# Patient Record
Sex: Female | Born: 1945 | Race: White | Hispanic: No | Marital: Married | Smoking: Former smoker
Health system: Southern US, Community
[De-identification: ages and names within clinical notes are randomized; demographics above are authoritative.]

## PROBLEM LIST (undated history)

## (undated) DIAGNOSIS — Q782 Osteopetrosis: Secondary | ICD-10-CM

## (undated) DIAGNOSIS — F0390 Unspecified dementia without behavioral disturbance: Secondary | ICD-10-CM

## (undated) DIAGNOSIS — T4145XA Adverse effect of unspecified anesthetic, initial encounter: Secondary | ICD-10-CM

## (undated) DIAGNOSIS — R112 Nausea with vomiting, unspecified: Secondary | ICD-10-CM

## (undated) DIAGNOSIS — N289 Disorder of kidney and ureter, unspecified: Secondary | ICD-10-CM

## (undated) DIAGNOSIS — I10 Essential (primary) hypertension: Secondary | ICD-10-CM

## (undated) DIAGNOSIS — T8859XA Other complications of anesthesia, initial encounter: Secondary | ICD-10-CM

## (undated) DIAGNOSIS — Z9889 Other specified postprocedural states: Secondary | ICD-10-CM

## (undated) HISTORY — PX: APPENDECTOMY: SHX54

## (undated) HISTORY — PX: BACK SURGERY: SHX140

## (undated) HISTORY — PX: ABDOMINAL HYSTERECTOMY: SHX81

---
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
## 2013-11-09 ENCOUNTER — Ambulatory Visit (INDEPENDENT_AMBULATORY_CARE_PROVIDER_SITE_OTHER): Payer: Medicare Other | Admitting: Diagnostic Neuroimaging

## 2013-11-09 ENCOUNTER — Encounter: Payer: Self-pay | Admitting: Diagnostic Neuroimaging

## 2013-11-09 VITALS — BP 129/79 | HR 70 | Ht 63.0 in | Wt 141.0 lb

## 2013-11-09 DIAGNOSIS — F03B0 Unspecified dementia, moderate, without behavioral disturbance, psychotic disturbance, mood disturbance, and anxiety: Secondary | ICD-10-CM

## 2013-11-09 DIAGNOSIS — F0390 Unspecified dementia without behavioral disturbance: Secondary | ICD-10-CM

## 2013-11-09 NOTE — Progress Notes (Signed)
[facility]  PATIENT: Tara Henderson DOB: 12-Aug-1945  REFERRING CLINICIAN: S Burdine HISTORY FROM: patient and daughter  REASON FOR VISIT: new consult   HISTORICAL  CHIEF COMPLAINT:  Chief Complaint  Patient presents with  . Memory Loss    HISTORY OF PRESENT ILLNESS:   68 year old right-handed female here for evaluation of memory loss. For past 2 years patient has had progressive short-term memory problems and cognitive decline. This is noticed by patient's sister who is living with at the time initially, but now by the daughter. Patient is still able to wake up, dress herself, cook, clean, live independently. She still drives to the store and shops. Last week she got lost while driving to visit her sister at the nursing home. No car accidents.  Patient has tried Aricept but could not tolerate due to GI side effects. She was transitioned to Namenda 10 mg twice a day and has maintained on that. There is some mild benefit initially but now patient has continued to progress.  No insomnia, mood difficulty, hallucinations, gait difficulty.  REVIEW OF SYSTEMS: Full 14 system review of systems performed and notable only for  Memory loss confusion.  ALLERGIES: No Known Allergies  HOME MEDICATIONS: No outpatient prescriptions prior to visit.   No facility-administered medications prior to visit.    PAST MEDICAL HISTORY: History reviewed. No pertinent past medical history.  PAST SURGICAL HISTORY: History reviewed. No pertinent past surgical history.  FAMILY HISTORY: Family History  Problem Relation Age of Onset  . Heart attack Mother   . Other Father     murdered    SOCIAL HISTORY:  History   Social History  . Marital Status: Married    Spouse Name: N/A    Number of Children: 1  . Years of Education: Hs   Occupational History  . Retired Other   Social History Main Topics  . Smoking status: Never Smoker   . Smokeless tobacco: Never Used  .  Alcohol Use: No  . Drug Use: No  . Sexual Activity: Not on file   Other Topics Concern  . Not on file   Social History Narrative   Patient lives at home alone.   Caffeine Use: very little     PHYSICAL EXAM  Filed Vitals:   11/09/13 0916  BP: 129/79  Pulse: 70  Height: 5\' 3"  (1.6 m)  Weight: 141 lb (63.957 kg)    Not recorded    Body mass index is 24.98 kg/(m^2).  GENERAL EXAM: Patient is in no distress; well developed, nourished and groomed; neck is supple  CARDIOVASCULAR: Regular rate and rhythm, no murmurs, no carotid bruits  NEUROLOGIC: MENTAL STATUS: awake, alert, oriented to person, NOT TIME OR PLACE. Recent memory intact, RECALL 0/3. CANNOT DRAW PENTAGONS OR WRITE SENTENCE. DECR attention and concentration, language fluent, comprehension intact, naming intact, fund of knowledge appropriate. MMSE 10/30. AFT 5. CANNOT DRAW CLOCK. GDS 0. NEG MYERSONS, PALMOMENTAL, SNOUT REFLEXES. CRANIAL NERVE: no papilledema on fundoscopic exam, pupils equal and reactive to light, visual fields full to confrontation, extraocular muscles intact, no nystagmus, facial sensation and strength symmetric, hearing intact, palate elevates symmetrically, uvula midline, shoulder shrug symmetric, tongue midline. MOTOR: normal bulk and tone, full strength in the BUE, BLE SENSORY: normal and symmetric to light touch, temperature, vibration  COORDINATION: finger-nose-finger, fine finger movements normal REFLEXES: deep tendon reflexes present and symmetric; ABSENT AT ANKLES GAIT/STATION: narrow based gait; romberg is negative    DIAGNOSTIC DATA (LABS, IMAGING, TESTING) -  I reviewed patient records, labs, notes, testing and imaging myself where available.  No results found for this basename: WBC, HGB, HCT, MCV, PLT   No results found for this basename: na, k, cl, co2, glucose, bun, creatinine, calcium, prot, albumin, ast, alt, alkphos, bilitot, gfrnonaa, gfraa   No results found for this  basename: CHOL, HDL, LDLCALC, LDLDIRECT, TRIG, CHOLHDL   No results found for this basename: HGBA1C   No results found for this basename: VITAMINB12   No results found for this basename: TSH     ASSESSMENT AND PLAN  68 y.o. year old female here with moderate dementia without behavioral disturbance.  PLAN: - continue namenda; may transition to XR 28mg  daily (getting meds through company assistance program) - caution with driving; probably should transition to stop driving based on low MMSE and also getting lost while driving last week - safety, prognosis and other issues reviewed with patient and duaghter  Return in about 6 months (around 05/12/2014).   Suanne MarkerVIKRAM R. PENUMALLI, MD 11/09/2013, 10:27 AM Certified in Neurology, Neurophysiology and Neuroimaging  [facility] [address], [address] [place], [place] [address] [phone]

## 2013-11-09 NOTE — Patient Instructions (Signed)
Dementia Dementia is a general term for problems with brain function. A person with dementia has memory loss and a hard time with at least one other brain function such as thinking, speaking, or problem solving. Dementia can affect social functioning, how you do your job, your mood, or your personality. The changes may be hidden for a long time. The earliest forms of this disease are usually not detected by family or friends. Dementia can be:  Irreversible.  Potentially reversible.  Partially reversible.  Progressive. This means it can get worse over time. SIGNS AND SYMPTOMS  Symptoms are often hard to detect. Family members or coworkers may not notice them early in the disease process. Different people with dementia may have different symptoms. Symptoms can include:  A hard time with memory, especially recent memory. Long-term memory may not be impaired.  Asking the same question multiple times or forgetting something someone just said.  A hard time speaking your thoughts or finding certain words.  A hard time solving problems or performing familiar tasks (such as how to use a telephone).  Sudden changes in mood.  Changes in personality, especially increasing moodiness or mistrust.  Depression.  A hard time understanding complex ideas that were never a problem in the past. DIAGNOSIS  There are no specific tests for dementia.   Your health care provider may recommend a thorough evaluation. This is because some forms of dementia can be reversible. The evaluation will likely include a physical exam and getting a detailed history from you and a family member. The history often gives the best clues and suggestions for a diagnosis.  Memory testing may be done. A detailed brain function evaluation called neuropsychologic testing may be helpful.  Lab tests and brain imaging (such as a CT scan or MRI scan) are sometimes important.  Sometimes observation and re-evaluation over time is  very helpful. TREATMENT  Treatment depends on the cause.   If the problem is a vitamin deficiency, it may be helped or cured with supplements.  For dementias such as Alzheimer disease, medicines are available to stabilize or slow the course of the disease. There are no cures for this type of dementia.  Your health care provider can help direct you to groups, organizations, and other health care providers to help with decisions in the care of you or your loved one. HOME CARE INSTRUCTIONS The care of individuals with dementia is varied and dependent upon the progression of the dementia. The following suggestions are intended for the person living with, or caring for, the person with dementia.  Create a safe environment.  Remove the locks on bathroom doors to prevent the person from accidentally locking himself or herself in.  Use childproof latches on kitchen cabinets and any place where cleaning supplies, chemicals, or alcohol are kept.  Use childproof covers in unused electrical outlets.  Install childproof devices to keep doors and windows secured.  Remove stove knobs or install safety knobs and an automatic shut-off on the stove.  Lower the temperature on water heaters.  Label medicines and keep them locked up.  Secure knives, lighters, matches, power tools, and guns, and keep these items out of reach.  Keep the house free from clutter. Remove rugs or anything that might contribute to a fall.  Remove objects that might break and hurt the person.  Make sure lighting is good, both inside and outside.  Install grab rails as needed.  Use a monitoring device to alert you to falls or other  needs for help.  Reduce confusion.  Keep familiar objects and people around.  Use night lights or dim lights at night.  Label items or areas.  Use reminders, notes, or directions for daily activities or tasks.  Keep a simple, consistent routine for waking, meals, bathing, dressing, and  bedtime.  Create a calm, quiet environment.  Place large clocks and calendars prominently.  Display emergency numbers and home address near all telephones.  Use cues to establish different times of the day. An example is to open curtains to let the natural light in during the day.   Use effective communication.  Choose simple words and short sentences.  Use a gentle, calm tone of voice.  Be careful not to interrupt.  If the person is struggling to find a word or communicate a thought, try to provide the word or thought.  Ask one question at a time. Allow the person ample time to answer questions. Repeat the question again if the person does not respond.  Reduce nighttime restlessness.  Provide a comfortable bed.  Have a consistent nighttime routine.  Ensure a regular walking or physical activity schedule. Involve the person in daily activities as much as possible.  Limit napping during the day.  Limit caffeine.  Attend social events that stimulate rather than overwhelm the senses.  Encourage good nutrition and hydration.  Reduce distractions during meal times and snacks.  Avoid foods that are too hot or too cold.  Monitor chewing and swallowing ability.  Continue with routine vision, hearing, dental, and medical screenings.  Give medicines only as directed by the health care provider.  Monitor driving abilities. Do not allow the person to drive when safe driving is no longer possible.  Register with an identification program which could provide location assistance in the event of a missing person situation. SEEK MEDICAL CARE IF:   New behavioral problems start such as moodiness, aggressiveness, or seeing things that are not there (hallucinations).  Any new problem with brain function happens. This includes problems with balance, speech, or falling a lot.  Problems with swallowing develop.  Any symptoms of other illness happen. Small changes or worsening in  any aspect of brain function can be a sign that the illness is getting worse. It can also be a sign of another medical illness such as infection. Seeing a health care provider right away is important. SEEK IMMEDIATE MEDICAL CARE IF:   A fever develops.  New or worsened confusion develops.  New or worsened sleepiness develops.  Staying awake becomes hard to do. Document Released: 09/19/2000 Document Revised: 08/10/2013 Document Reviewed: 08/21/2010 [facility] Patient Information 2015 [place] [place]. This information is not intended to replace advice given to you by your health care provider. Make sure you discuss any questions you have with your health care provider.

## 2014-05-12 ENCOUNTER — Ambulatory Visit: Payer: No Typology Code available for payment source | Admitting: Diagnostic Neuroimaging

## 2014-05-13 ENCOUNTER — Encounter: Payer: Self-pay | Admitting: Diagnostic Neuroimaging

## 2016-02-16 ENCOUNTER — Emergency Department (HOSPITAL_COMMUNITY)
Admission: EM | Admit: 2016-02-16 | Discharge: 2016-02-17 | Disposition: A | Discharge status: Home / Self Care | Payer: Medicare Other | Attending: Emergency Medicine | Admitting: Emergency Medicine

## 2016-02-16 ENCOUNTER — Emergency Department (HOSPITAL_COMMUNITY): Payer: Medicare Other

## 2016-02-16 ENCOUNTER — Encounter (HOSPITAL_COMMUNITY): Payer: Self-pay | Admitting: *Deleted

## 2016-02-16 DIAGNOSIS — Z7982 Long term (current) use of aspirin: Secondary | ICD-10-CM | POA: Diagnosis not present

## 2016-02-16 DIAGNOSIS — F0390 Unspecified dementia without behavioral disturbance: Secondary | ICD-10-CM | POA: Insufficient documentation

## 2016-02-16 DIAGNOSIS — R4182 Altered mental status, unspecified: Secondary | ICD-10-CM | POA: Insufficient documentation

## 2016-02-16 DIAGNOSIS — R0989 Other specified symptoms and signs involving the circulatory and respiratory systems: Secondary | ICD-10-CM | POA: Diagnosis not present

## 2016-02-16 DIAGNOSIS — Z79899 Other long term (current) drug therapy: Secondary | ICD-10-CM | POA: Diagnosis not present

## 2016-02-16 DIAGNOSIS — I10 Essential (primary) hypertension: Secondary | ICD-10-CM | POA: Diagnosis not present

## 2016-02-16 HISTORY — DX: Disorder of kidney and ureter, unspecified: N28.9

## 2016-02-16 HISTORY — DX: Hypercalcemia: E83.52

## 2016-02-16 HISTORY — DX: Essential (primary) hypertension: I10

## 2016-02-16 HISTORY — DX: Unspecified dementia, unspecified severity, without behavioral disturbance, psychotic disturbance, mood disturbance, and anxiety: F03.90

## 2016-02-16 HISTORY — DX: Osteopetrosis: Q78.2

## 2016-02-16 LAB — COMPREHENSIVE METABOLIC PANEL
ALK PHOS: 55 U/L (ref 38–126)
ALT: 21 U/L (ref 14–54)
AST: 23 U/L (ref 15–41)
Albumin: 4.2 g/dL (ref 3.5–5.0)
Anion gap: 7 (ref 5–15)
BILIRUBIN TOTAL: 0.5 mg/dL (ref 0.3–1.2)
BUN: 15 mg/dL (ref 6–20)
CALCIUM: 9.4 mg/dL (ref 8.9–10.3)
CHLORIDE: 105 mmol/L (ref 101–111)
CO2: 28 mmol/L (ref 22–32)
CREATININE: 0.93 mg/dL (ref 0.44–1.00)
Glucose, Bld: 131 mg/dL — ABNORMAL HIGH (ref 65–99)
Potassium: 3.7 mmol/L (ref 3.5–5.1)
Sodium: 140 mmol/L (ref 135–145)
Total Protein: 6.8 g/dL (ref 6.5–8.1)

## 2016-02-16 LAB — CBC
HCT: 40.8 % (ref 36.0–46.0)
Hemoglobin: 13.7 g/dL (ref 12.0–15.0)
MCH: 28.4 pg (ref 26.0–34.0)
MCHC: 33.6 g/dL (ref 30.0–36.0)
MCV: 84.6 fL (ref 78.0–100.0)
PLATELETS: 228 10*3/uL (ref 150–400)
RBC: 4.82 MIL/uL (ref 3.87–5.11)
RDW: 13.2 % (ref 11.5–15.5)
WBC: 10.4 10*3/uL (ref 4.0–10.5)

## 2016-02-16 LAB — URINALYSIS, ROUTINE W REFLEX MICROSCOPIC
Bilirubin Urine: NEGATIVE
GLUCOSE, UA: NEGATIVE mg/dL
HGB URINE DIPSTICK: NEGATIVE
KETONES UR: NEGATIVE mg/dL
LEUKOCYTES UA: NEGATIVE
Nitrite: NEGATIVE
PROTEIN: NEGATIVE mg/dL
Specific Gravity, Urine: 1.013 (ref 1.005–1.030)
pH: 7.5 (ref 5.0–8.0)

## 2016-02-16 NOTE — ED Provider Notes (Signed)
[facility]-EMERGENCY DEPT Provider Note   CSN: [id number] Arrival date & time: 02/16/16  2145 By signing my name below, I, Linus Galas, attest that this documentation has been prepared under the direction and in the presence of Melburn Hake, [place]. Electronically Signed: Linus Galas, ED Scribe. 02/16/16. 10:58 PM.  History   Chief Complaint Chief Complaint  Patient presents with  . Altered Mental Status   The history is provided by the nursing home and the EMS personnel. No language interpreter was used.   HPI Comments: Tara Henderson is a 70 y.o. female who presents to the Emergency Department via EMS with a PMHx of dementia and HTN for an evaluation of altered mental status prior to arrival. Pt was brought from [facility] where the NH staff reported the pt was" more tired than normal" after taking her evening medications which include Trazadone. EMS found the pt sleeping in the chair. Pt with dementia at baseline and can only recall her name. Reports mild intermittent cough.    Level V caveat-dementia  Past Medical History:  Diagnosis Date  . Dementia   . Hypercalcemia   . Hypertension   . Osteopetrosis   . Renal disorder    Patient Active Problem List   Diagnosis Date Noted  . Moderate dementia without behavioral disturbance 11/09/2013   No past surgical history on file.  OB History    No data available     Home Medications    Prior to Admission medications   Medication Sig Start Date End Date Taking? Authorizing Provider  acetaminophen (TYLENOL) 500 MG tablet Take 500 mg by mouth every 4 (four) hours as needed for mild pain.   Yes Historical Provider, MD  ALPRAZolam Prudy Feeler) 0.25 MG tablet Take 0.25 mg by mouth daily as needed (agitation).   Yes Historical Provider, MD  alum & mag hydroxide-simeth (MAALOX PLUS) 400-400-40 MG/5ML suspension Take 15 mLs by mouth every 6 (six) hours as needed for indigestion.   Yes Historical Provider, MD  aspirin 81 MG chewable  tablet Chew 81 mg by mouth daily.   Yes Historical Provider, MD  atorvastatin (LIPITOR) 20 MG tablet Take 20 mg by mouth daily.   Yes Historical Provider, MD  cholecalciferol (VITAMIN D) 1000 units tablet Take 2,000 Units by mouth daily.   Yes Historical Provider, MD  guaifenesin (ROBITUSSIN) 100 MG/5ML syrup Take 200 mg by mouth 3 (three) times daily as needed for cough.   Yes Historical Provider, MD  loperamide (IMODIUM) 2 MG capsule Take 2 mg by mouth as needed for diarrhea or loose stools.   Yes Historical Provider, MD  magnesium hydroxide (MILK OF MAGNESIA) 400 MG/5ML suspension Take 30 mLs by mouth at bedtime as needed for mild constipation.   Yes Historical Provider, MD  Memantine HCl ER (NAMENDA XR) 21 MG CP24 Take 1 capsule by mouth daily.   Yes Historical Provider, MD  neomycin-bacitracin-polymyxin (NEOSPORIN) [phone] ointment Apply 1 application topically as needed (skin tears).   Yes Historical Provider, MD  traZODone (DESYREL) 50 MG tablet Take 50 mg by mouth at bedtime.   Yes Historical Provider, MD   Family History Family History  Problem Relation Age of Onset  . Heart attack Mother   . Other Father     murdered    Social History Social History  Substance Use Topics  . Smoking status: Never Smoker  . Smokeless tobacco: Never Used  . Alcohol use No   Allergies   Patient has no known allergies.  Review  of Systems Review of Systems  Unable to perform ROS: Dementia   Physical Exam Updated Vital Signs BP 123/68 (BP Location: Left Arm)   Pulse 68   Temp 100.3 F (37.9 C) (Rectal)   Resp 17   SpO2 96%   Physical Exam  Constitutional: She appears well-developed and well-nourished. No distress.  Pt sleeping but easily arousable.  HENT:  Head: Normocephalic and atraumatic.  Mouth/Throat: Oropharynx is clear and moist. No oropharyngeal exudate.  Eyes: Conjunctivae and EOM are normal. Pupils are equal, round, and reactive to light. Right eye exhibits no discharge.  Left eye exhibits no discharge. No scleral icterus.  Neck: Normal range of motion. Neck supple.  Cardiovascular: Normal rate, regular rhythm, normal heart sounds and intact distal pulses.   Pulmonary/Chest: Effort normal. No respiratory distress. She has no wheezes. She has rales (mild rales noted in left lower lobe). She exhibits no tenderness.  Abdominal: Soft. Bowel sounds are normal. She exhibits no distension and no mass. There is no tenderness. There is no rebound and no guarding. No hernia.  Musculoskeletal: Normal range of motion. She exhibits no edema or tenderness.  Neurological: She is alert.  Exam limited due to pt compliance. Pt is only alert to person. Pt will not follow commands but is moving all four extremities spontaneously.   Skin: Skin is warm and dry. Capillary refill takes less than 2 seconds. No rash noted. She is not diaphoretic.  Nursing note and vitals reviewed.  ED Treatments / Results  DIAGNOSTIC STUDIES: Oxygen Saturation is 91% on room air, low by my interpretation.    COORDINATION OF CARE: 10:58 PM Discussed treatment plan with pt at bedside and pt agreed to plan.  Labs (all labs ordered are listed, but only abnormal results are displayed) Labs Reviewed  COMPREHENSIVE METABOLIC PANEL - Abnormal; Notable for the following:       Result Value   Glucose, Bld 131 (*)    All other components within normal limits  CBC  URINALYSIS, ROUTINE W REFLEX MICROSCOPIC (NOT AT [facility])    EKG  EKG Interpretation  Date/Time:  Thursday February 16 2016 21:57:58 EST Ventricular Rate:  80 PR Interval:    QRS Duration: 82 QT Interval:  377 QTC Calculation: 435 R Axis:   65 Text Interpretation:  Sinus rhythm LAE, consider biatrial enlargement Otherwise within normal limits No old tracing to compare Confirmed by [facility] HospitalGLICK  MD, DAVID ([id number]) on 02/16/2016 11:27:26 PM       Radiology Dg Chest 2 View  Result Date: 02/16/2016 CLINICAL DATA:  Cough and rales EXAM: CHEST  2  VIEW COMPARISON:  None. FINDINGS: AP semi-erect view demonstrates linear atelectasis or fibrosis at the lung bases. No acute consolidation or effusion. Heart size normal. Tortuous aorta with atherosclerosis. No pneumothorax. IMPRESSION: Mild bibasilar atelectasis or scarring.  No acute infiltrate. Atherosclerosis Electronically Signed   By: Jasmine PangKim  Fujinaga M.D.   On: 02/16/2016 23:27    Procedures Procedures (including critical care time)  Medications Ordered in ED Medications - No data to display   Initial Impression / Assessment and Plan / ED Course  I have reviewed the triage vital signs and the nursing notes.  Pertinent labs & imaging results that were available during my care of the patient were reviewed by me and considered in my medical decision making (see chart for details).  Clinical Course     Patient presents from nursing facility with reported altered mental status after taking her evening medications that included trazodone.  Nursing facility reports patient was more sleepy. History of dementa. VSS. Exam limited due to pt compliance. Mild rales noted in LLQ. Pt alert to self only. Moving all 4 extremities spontaneously. Abdominal exam benign. EKG showed sinus rhythm with no acute ischemic changes. Labs and UA unremarkable. No signs of infection. Pt appears to be at baseline mental status. [facility].   Final Clinical Impressions(s) / ED Diagnoses   Final diagnoses:  Altered mental status, unspecified altered mental status type    New Prescriptions Discharge Medication List as of 02/17/2016 12:36 AM     I personally performed the services described in this documentation, which was scribed in my presence. The recorded information has been reviewed and is accurate.     Satira Sarkicole Elizabeth BenhamNadeau, [place]-C 02/17/16 0304    Dione Boozeavid Glick, MD 02/17/16 [phone]

## 2016-02-16 NOTE — ED Notes (Signed)
Bed: [id number] Expected date:  Expected time:  Means of arrival:  Comments[facility]

## 2016-02-16 NOTE — ED Triage Notes (Signed)
[facility] Per report after taking her evening medications (including Trazadone), staff reports "more lethargic than normal" On arrival to scene, pt was sleeping the chair in the activity room at the facility. Pt has dementia and at baseline, pt only can recall her name. VSS en route. Pt with intermittent cough.RA sats were 91-92% improved to 95% on 2 liters. CBG 184.

## 2016-02-17 NOTE — Discharge Instructions (Signed)
Continue taking your home medications as prescribed. I recommend following up with your primary care provider within the next week for follow-up evaluation. Return to the emergency department if symptoms worsen or new onset of fever, change in behavior/change in mental status, weakness, chest pain, difficulty breathing, productive cough, urinary symptoms, abdominal pain, vomiting.

## 2016-09-24 ENCOUNTER — Encounter (HOSPITAL_COMMUNITY): Payer: Self-pay | Admitting: Nurse Practitioner

## 2016-09-24 ENCOUNTER — Emergency Department (HOSPITAL_COMMUNITY): Payer: Medicare Other

## 2016-09-24 ENCOUNTER — Emergency Department (HOSPITAL_COMMUNITY)
Admission: EM | Admit: 2016-09-24 | Discharge: 2016-09-24 | Disposition: A | Discharge status: Skilled Nursing Facility | Payer: Medicare Other | Attending: Emergency Medicine | Admitting: Emergency Medicine

## 2016-09-24 DIAGNOSIS — Y92129 Unspecified place in nursing home as the place of occurrence of the external cause: Secondary | ICD-10-CM | POA: Diagnosis not present

## 2016-09-24 DIAGNOSIS — W07XXXA Fall from chair, initial encounter: Secondary | ICD-10-CM | POA: Insufficient documentation

## 2016-09-24 DIAGNOSIS — Z79899 Other long term (current) drug therapy: Secondary | ICD-10-CM | POA: Insufficient documentation

## 2016-09-24 DIAGNOSIS — S0003XA Contusion of scalp, initial encounter: Secondary | ICD-10-CM | POA: Diagnosis not present

## 2016-09-24 DIAGNOSIS — S8001XA Contusion of right knee, initial encounter: Secondary | ICD-10-CM | POA: Diagnosis not present

## 2016-09-24 DIAGNOSIS — Y939 Activity, unspecified: Secondary | ICD-10-CM | POA: Diagnosis not present

## 2016-09-24 DIAGNOSIS — N189 Chronic kidney disease, unspecified: Secondary | ICD-10-CM | POA: Diagnosis not present

## 2016-09-24 DIAGNOSIS — I129 Hypertensive chronic kidney disease with stage 1 through stage 4 chronic kidney disease, or unspecified chronic kidney disease: Secondary | ICD-10-CM | POA: Insufficient documentation

## 2016-09-24 DIAGNOSIS — N390 Urinary tract infection, site not specified: Secondary | ICD-10-CM

## 2016-09-24 DIAGNOSIS — W19XXXA Unspecified fall, initial encounter: Secondary | ICD-10-CM

## 2016-09-24 DIAGNOSIS — S0990XA Unspecified injury of head, initial encounter: Secondary | ICD-10-CM | POA: Diagnosis present

## 2016-09-24 DIAGNOSIS — Y999 Unspecified external cause status: Secondary | ICD-10-CM | POA: Diagnosis not present

## 2016-09-24 LAB — URINALYSIS, ROUTINE W REFLEX MICROSCOPIC
BILIRUBIN URINE: NEGATIVE
GLUCOSE, UA: NEGATIVE mg/dL
HGB URINE DIPSTICK: NEGATIVE
Ketones, ur: NEGATIVE mg/dL
NITRITE: NEGATIVE
PH: 5 (ref 5.0–8.0)
Protein, ur: NEGATIVE mg/dL
Specific Gravity, Urine: 1.021 (ref 1.005–1.030)

## 2016-09-24 LAB — CBC WITH DIFFERENTIAL/PLATELET
BASOS PCT: 0 %
Basophils Absolute: 0 10*3/uL (ref 0.0–0.1)
Eosinophils Absolute: 0.1 10*3/uL (ref 0.0–0.7)
Eosinophils Relative: 1 %
HEMATOCRIT: 38.5 % (ref 36.0–46.0)
HEMOGLOBIN: 12.2 g/dL (ref 12.0–15.0)
LYMPHS ABS: 1.3 10*3/uL (ref 0.7–4.0)
Lymphocytes Relative: 18 %
MCH: 28.2 pg (ref 26.0–34.0)
MCHC: 31.7 g/dL (ref 30.0–36.0)
MCV: 89.1 fL (ref 78.0–100.0)
MONO ABS: 0.4 10*3/uL (ref 0.1–1.0)
MONOS PCT: 5 %
Neutro Abs: 5.5 10*3/uL (ref 1.7–7.7)
Neutrophils Relative %: 76 %
Platelets: 253 10*3/uL (ref 150–400)
RBC: 4.32 MIL/uL (ref 3.87–5.11)
RDW: 13.4 % (ref 11.5–15.5)
WBC: 7.3 10*3/uL (ref 4.0–10.5)

## 2016-09-24 LAB — COMPREHENSIVE METABOLIC PANEL
ALBUMIN: 3.9 g/dL (ref 3.5–5.0)
ALK PHOS: 58 U/L (ref 38–126)
ALT: 13 U/L — ABNORMAL LOW (ref 14–54)
ANION GAP: 6 (ref 5–15)
AST: 19 U/L (ref 15–41)
BUN: 31 mg/dL — ABNORMAL HIGH (ref 6–20)
CALCIUM: 9.3 mg/dL (ref 8.9–10.3)
CO2: 28 mmol/L (ref 22–32)
Chloride: 109 mmol/L (ref 101–111)
Creatinine, Ser: 0.91 mg/dL (ref 0.44–1.00)
GFR calc non Af Amer: >60 mL/min (ref >60)
GLUCOSE: 113 mg/dL — AB (ref 65–99)
POTASSIUM: 3.6 mmol/L (ref 3.5–5.1)
Sodium: 143 mmol/L (ref 135–145)
TOTAL PROTEIN: 6.6 g/dL (ref 6.5–8.1)
Total Bilirubin: 0.4 mg/dL (ref 0.3–1.2)

## 2016-09-24 MED ORDER — CEPHALEXIN 500 MG PO CAPS: 500.0000 mg | ORAL_CAPSULE | Freq: Two times a day (BID) | ORAL | 0 refills | Status: AC

## 2016-09-24 NOTE — ED Triage Notes (Signed)
[facility] after suffering a fall from her wheelchair today. Facility staff reports that she hit her head and has a palpable 'knot' on right side of her head. Patient complained of pain in right arm and right leg to EMS but mostly non-verbal. History of dementia. In C-collar, alert, folling commands on arrival.

## 2016-09-24 NOTE — ED Notes (Signed)
[facility] picking up patient. 

## 2016-09-24 NOTE — ED Notes (Addendum)
Called to check on status of patient being transported back to facility.

## 2016-09-24 NOTE — ED Notes (Signed)
[facility] Expected date:  Expected time:  Means of arrival:  Comments: EMS 71 yo, Fall

## 2016-09-24 NOTE — ED Provider Notes (Signed)
[facility]-EMERGENCY DEPT Provider Note   CSN: [id number] Arrival date & time: 09/24/16  1217     History   Chief Complaint Chief Complaint  Patient presents with  . Fall    HPI Tara Henderson is a 71 y.o. female with a PMHx of dementia, hypercalcemia, HTN, osteopetrosis, and renal disorder, who presents to the ED with complaints of fall at nursing home which was witnessed by nursing home staff and family members. Level V caveat due to dementia, history obtained by Tammy RN at nursing home. Tammy states that the patient fell from a chair, which was witnessed, unclear exactly why she fell however she did hit her head on the wood floor. They deny any other impact to any other areas of her body. No LOC. She is not on any blood thinners, she has been at her baseline since then. They deny any recent fevers, cough, URI symptoms, malodorous urine, or any other recent illnesses. Patient denies any pain anywhere, however she is demented and is unable to provide any further HPI/ROS information.   The history is provided by the patient, medical records and the nursing home. The history is limited by the absence of a caregiver. No language interpreter was used.  Fall  This is a new problem. The current episode started 3 to 5 hours ago. The problem occurs rarely. The problem has not changed since onset.Nothing aggravates the symptoms. Nothing relieves the symptoms. She has tried nothing for the symptoms. The treatment provided no relief.    Past Medical History:  Diagnosis Date  . Dementia   . Hypercalcemia   . Hypertension   . Osteopetrosis   . Renal disorder     Patient Active Problem List   Diagnosis Date Noted  . Moderate dementia without behavioral disturbance 11/09/2013    History reviewed. No pertinent surgical history.  OB History    No data available       Home Medications    Prior to Admission medications   Medication Sig Start Date End Date Taking? Authorizing Provider    acetaminophen (TYLENOL) 500 MG tablet Take 500 mg by mouth every 4 (four) hours as needed for mild pain.   Yes [provider]  ALPRAZolam (XANAX) 0.25 MG tablet Take 0.25 mg by mouth daily as needed (agitation).   Yes [provider]  alum & mag hydroxide-simeth (MAALOX PLUS) 400-400-40 MG/5ML suspension Take 15 mLs by mouth every 6 (six) hours as needed for indigestion.   Yes [provider]  aspirin 81 MG chewable tablet Chew 81 mg by mouth daily.   Yes [provider]  atorvastatin (LIPITOR) 20 MG tablet Take 20 mg by mouth daily.   Yes [provider]  cholecalciferol (VITAMIN D) 1000 units tablet Take 2,000 Units by mouth daily.   Yes [provider]  escitalopram (LEXAPRO) 10 MG tablet Take 10 mg by mouth daily. 09/20/16  Yes [provider]  guaifenesin (ROBITUSSIN) 100 MG/5ML syrup Take 200 mg by mouth 3 (three) times daily as needed for cough.   Yes [provider]  loperamide (IMODIUM) 2 MG capsule Take 2 mg by mouth as needed for diarrhea or loose stools.   Yes [provider]  magnesium hydroxide (MILK OF MAGNESIA) 400 MG/5ML suspension Take 30 mLs by mouth at bedtime as needed for mild constipation.   Yes [provider]  Memantine HCl ER (NAMENDA XR) 21 MG CP24 Take 1 capsule by mouth daily.   Yes [provider]  neomycin-bacitracin-polymyxin (NEOSPORIN) [phone] ointment Apply 1 application topically as needed (skin tears).   Yes [provider]  traZODone (DESYREL) 50 MG tablet Take 50 mg by mouth at bedtime.   Yes [provider]    Family History Family History  Problem Relation Age of Onset  . Heart attack Mother   . Other Father        murdered    Social History Social History  Substance Use Topics  . Smoking status: Never Smoker  . Smokeless tobacco: Never Used  . Alcohol use No     Allergies   Patient has no known allergies.   Review of  Systems Review of Systems  Unable to perform ROS: Dementia  HENT: Negative for facial swelling.   Respiratory: Negative for cough.   Genitourinary:       No malodorous urine per nursing staff  Neurological: Negative for syncope.  Hematological: Does not bruise/bleed easily.   Level 5 caveat due to dementia  Physical Exam Updated Vital Signs BP 132/84   Pulse 66   Temp (!) 96.6 F (35.9 C) (Oral)   Resp 16   SpO2 97%   Physical Exam  Constitutional: She is oriented to person, place, and time. Vital signs are normal. She appears well-developed and well-nourished.  Non-toxic appearance. No distress. Cervical collar in place.  Afebrile, nontoxic, NAD, c-collar in place  HENT:  Head: Normocephalic. Head is with contusion. Head is without raccoon's eyes, without Battle's sign and without abrasion.    Mouth/Throat: Oropharynx is clear and moist and mucous membranes are normal.  Small hematoma on R temporal/lateral scalp, no abrasions/lacerations, no raccoon eyes or battle's sign, no scalp crepitus or TTP. No bony instability.   Eyes: Conjunctivae and EOM are normal. Pupils are equal, round, and reactive to light. Right eye exhibits no discharge. Left eye exhibits no discharge.  PERRL, EOMI, no obvious nystagmus although pt can't cooperate with this more complex command  Neck: No spinous process tenderness and no muscular tenderness present.  No definite tenderness to C-spine, c-collar in place, no bruising, no stepoffs or deformities  Cardiovascular: Normal rate, regular rhythm, normal heart sounds and intact distal pulses.  Exam reveals no gallop and no friction rub.   No murmur heard. Pulmonary/Chest: Effort normal and breath sounds normal. No respiratory distress. She has no decreased breath sounds. She has no wheezes. She has no rhonchi. She has no rales.  Abdominal: Soft. Normal appearance and bowel sounds are normal. She exhibits no distension. There is no tenderness. There is no  rigidity, no rebound, no guarding, no CVA tenderness, no tenderness at McBurney's point and negative Murphy's sign.  Musculoskeletal: Normal range of motion.  C-spine as above, all other spinal levels nonTTP without bony stepoffs or deformities  R knee with FROM intact, no joint line or bony TTP, with slight prepatellar swelling and small bruise, slight click felt with full extension, but full flexion without crepitus, no effusion/deformity, no erythema or warmth, no abnormal alignment or patellar mobility, no varus/valgus laxity, neg anterior drawer test, no crepitus.  B/l arms with FROM intact at all joints, no new bruising or swelling, no evidence of acute injury, no focal tenderness. Strength and sensation grossly intact, distal pulses intact, compartments soft   Neurological: She is alert and oriented to person, place, and time. She has normal strength. No sensory deficit.  Skin: Skin is warm, dry and intact. No rash noted.  Psychiatric: She has a normal mood and affect.  Nursing  note and vitals reviewed.    ED Treatments / Results  Labs (all labs ordered are listed, but only abnormal results are displayed) Labs Reviewed  COMPREHENSIVE METABOLIC PANEL - Abnormal; Notable for the following:       Result Value   Glucose, Bld 113 (*)    BUN 31 (*)    ALT 13 (*)    All other components within normal limits  URINALYSIS, ROUTINE W REFLEX MICROSCOPIC - Abnormal; Notable for the following:    Leukocytes, UA TRACE (*)    Bacteria, UA RARE (*)    Squamous Epithelial / LPF 0-5 (*)    All other components within normal limits  URINE CULTURE  CBC WITH DIFFERENTIAL/PLATELET    EKG  EKG Interpretation None       Radiology Ct Head Wo Contrast  Result Date: 09/24/2016 CLINICAL DATA:  Larey Seat from wheelchair today, struck her head, knot at RIGHT side of head, RIGHT arm and leg pain, history dementia, hypertension EXAM: CT HEAD WITHOUT CONTRAST CT CERVICAL SPINE WITHOUT CONTRAST TECHNIQUE:  Multidetector CT imaging of the head and cervical spine was performed following the standard protocol without intravenous contrast. Multiplanar CT image reconstructions of the cervical spine were also generated. COMPARISON:  None. FINDINGS: CT HEAD FINDINGS Brain: Generalized atrophy. Normal ventricular morphology. No midline shift or mass effect. Minimal small vessel chronic ischemic changes of deep cerebral white matter. No intracranial hemorrhage, mass lesion or evidence of acute infarction. No extra-axial fluid collections. Vascular: Atherosclerotic calcifications at the carotid siphons. Skull: Intact Sinuses/Orbits: Cleared Other: N/A CT CERVICAL SPINE FINDINGS Alignment: Minimal retrolisthesis at C5-C6. Remaining alignment normal. Skull base and vertebrae: Views osseous demineralization. Multilevel facet degenerative changes greater on LEFT. Vertebral body heights maintained without fracture or bone destruction. Visualized skullbase intact. Soft tissues and spinal canal: The prevertebral soft tissues normal thickness. Atherosclerotic calcifications at the carotid bifurcations bilaterally. Disc levels: Multilevel disc space narrowing. Scattered tiny endplate spurs greatest at C5-C6 and C6-C7. Uncovertebral spurs encroach upon BILATERAL cervical neural foramina at C5-C6 and C6-C7. Upper chest: Emphysematous changes at lung apices. Other: N/A IMPRESSION: Atrophy with small vessel chronic ischemic changes of deep cerebral white matter. No acute intracranial abnormalities. Osseous demineralization with multilevel degenerative disc and facet disease changes of the cervical spine associated with minimal retrolisthesis at C5-C6 and uncovertebral spur encroachment upon foramina bilaterally at C5-C6 and C6-C7. No acute cervical spine abnormalities. Electronically Signed   By: Ulyses Southward M.D.   On: 09/24/2016 14:58   Ct Cervical Spine Wo Contrast  Result Date: 09/24/2016 CLINICAL DATA:  Larey Seat from wheelchair today,  struck her head, knot at RIGHT side of head, RIGHT arm and leg pain, history dementia, hypertension EXAM: CT HEAD WITHOUT CONTRAST CT CERVICAL SPINE WITHOUT CONTRAST TECHNIQUE: Multidetector CT imaging of the head and cervical spine was performed following the standard protocol without intravenous contrast. Multiplanar CT image reconstructions of the cervical spine were also generated. COMPARISON:  None. FINDINGS: CT HEAD FINDINGS Brain: Generalized atrophy. Normal ventricular morphology. No midline shift or mass effect. Minimal small vessel chronic ischemic changes of deep cerebral white matter. No intracranial hemorrhage, mass lesion or evidence of acute infarction. No extra-axial fluid collections. Vascular: Atherosclerotic calcifications at the carotid siphons. Skull: Intact Sinuses/Orbits: Cleared Other: N/A CT CERVICAL SPINE FINDINGS Alignment: Minimal retrolisthesis at C5-C6. Remaining alignment normal. Skull base and vertebrae: Views osseous demineralization. Multilevel facet degenerative changes greater on LEFT. Vertebral body heights maintained without fracture or bone destruction. Visualized skullbase intact. Soft tissues and  spinal canal: The prevertebral soft tissues normal thickness. Atherosclerotic calcifications at the carotid bifurcations bilaterally. Disc levels: Multilevel disc space narrowing. Scattered tiny endplate spurs greatest at C5-C6 and C6-C7. Uncovertebral spurs encroach upon BILATERAL cervical neural foramina at C5-C6 and C6-C7. Upper chest: Emphysematous changes at lung apices. Other: N/A IMPRESSION: Atrophy with small vessel chronic ischemic changes of deep cerebral white matter. No acute intracranial abnormalities. Osseous demineralization with multilevel degenerative disc and facet disease changes of the cervical spine associated with minimal retrolisthesis at C5-C6 and uncovertebral spur encroachment upon foramina bilaterally at C5-C6 and C6-C7. No acute cervical spine  abnormalities. Electronically Signed   By: Ulyses SouthwardMark  Boles M.D.   On: 09/24/2016 14:58   Dg Knee Complete 4 Views Right  Result Date: 09/24/2016 CLINICAL DATA:  Fall from wheelchair today at nursing facility. Severe dementia. EXAM: RIGHT KNEE - COMPLETE 4+ VIEW COMPARISON:  None. FINDINGS: No evidence of fracture, dislocation, or joint effusion. No evidence of arthropathy or other focal bone abnormality. Osteopenia. Soft tissues are unremarkable. IMPRESSION: No acute fracture deformity or dislocation. Electronically Signed   By: Awilda Metroourtnay  Bloomer M.D.   On: 09/24/2016 15:24   Dg Femur Min 2 Views Right  Result Date: 09/24/2016 CLINICAL DATA:  Pain following fall EXAM: RIGHT FEMUR 2 VIEWS COMPARISON:  None. FINDINGS: Frontal and lateral views were obtained. There is no fracture or dislocation. No knee joint effusion evident. Joint spaces appear unremarkable. No erosive change. No abnormal periosteal reaction. IMPRESSION: No fracture or dislocation. No appreciable arthropathy. No knee joint effusion evident. Electronically Signed   By: Bretta BangWilliam  Woodruff III M.D.   On: 09/24/2016 15:21    Procedures Procedures (including critical care time)  Medications Ordered in ED Medications - No data to display   Initial Impression / Assessment and Plan / ED Course  I have reviewed the triage vital signs and the nursing notes.  Pertinent labs & imaging results that were available during my care of the patient were reviewed by me and considered in my medical decision making (see chart for details).     71 y.o. female here with fall at nursing home. Level 5 caveat applies due to dementia. Nursing home staff witnessed the fall, report that she hit her head on wood floor. On exam, pt follows very simple commands but mostly just laughs at questions. Denies pain. Has bruising to R knee, with mild clicking felt with full extension, but full flexion without difficulty or crepitus. No gross deformity. B/l arms without  evidence of trauma or injury. Small knot on R scalp, no crepitus or deformity. C-collar in place. Will get basic labs and CT head/neck and R femur/knee xrays. Will reassess shortly.   5:17 PM Pt still doing well, laying in bed. CBC w/diff unremarkable. CMP with marginally elevated BUN 31 but otherwise WNL. CT head/neck without acute findings, multilevel degenerative findings. Xray R femur/knee negative. U/A with trace leuks, 6-30 WBCs, 0-5 squamous (cath'd urine sample) and although rare bacteria reported, will send for culture and treat empirically for UTI. Pt stable to be discharged back to facility. Instructions to use tylenol/motrin/ice to areas of soreness and for pain. Stay hydrated. Use abx for UTI. F/up with PCP in 3-5 days for recheck. I have written explicit precautions to return to the ER including for any other new or worsening symptoms, written in the d/c paperwork for the nursing staff at nursing home. Discharge instructions concerning home care and prescriptions have been given. The patient is STABLE and is discharged  to nursing home in good condition.    Final Clinical Impressions(s) / ED Diagnoses   Final diagnoses:  Fall, initial encounter  Contusion of scalp, initial encounter  Contusion of right knee, initial encounter  Lower urinary tract infectious disease    New Prescriptions New Prescriptions   CEPHALEXIN (KEFLEX) 500 MG CAPSULE    Take 1 capsule (500 mg total) by mouth 2 (two) times daily.     [address] [facility] [place], [place] 09/24/16 1718

## 2016-09-24 NOTE — Discharge Instructions (Signed)
Your urine sample shows that you have a urinary tract infection. Take antibiotics as directed until completed. Alternate between Ibuprofen and Tylenol for pain. Use ice on your head. Ice and elevate your knee to help with pain and swelling. Follow up with your regular doctor in 3-5 days for recheck of symptoms. Return to the emergency department if patient becomes lethargic, begins vomiting or other change in mental status, or any other changes/worsening symptoms.

## 2016-09-24 NOTE — ED Notes (Signed)
Patient's daughter called. Update provided. Patient's family member also given opportunity to speak to patient.

## 2016-09-24 NOTE — ED Notes (Signed)
Pt given meal. Pt ate independently.

## 2016-09-24 NOTE — ED Notes (Signed)
Safety sitter arrives.

## 2016-09-24 NOTE — ED Provider Notes (Signed)
Medical screening examination/treatment/procedure(s) were conducted as a shared visit with non-physician practitioner(s) and myself.  I personally evaluated the patient during the encounter.   EKG Interpretation None     pt here after a witnessed fall and striking her head xrays neg, pt at baseline Stable for d/c   Lorre NickAllen, Marene Gilliam, MD 09/24/16 1603

## 2016-09-27 LAB — URINE CULTURE

## 2016-09-28 ENCOUNTER — Telehealth: Payer: Self-pay | Admitting: *Deleted

## 2016-09-28 NOTE — Telephone Encounter (Signed)
Post ED Visit - Positive Culture Follow-up  Culture report reviewed by antimicrobial stewardship pharmacist:  []  Enzo BiNathan Batchelder, Pharm.D. []  Celedonio MiyamotoJeremy Frens, Pharm.D., BCPS AQ-ID []  Garvin FilaMike Maccia, Pharm.D., BCPS []  Georgina PillionElizabeth Martin, Pharm.D., BCPS []  Mountain ParkMinh Pham, [address] Rainbow BoulevardPharm.D., BCPS, AAHIVP []  Estella HuskMichelle Turner, Pharm.D., BCPS, AAHIVP []  Lysle Pearlachel Rumbarger, PharmD, BCPS [x]  Casilda Carlsaylor Stone, PharmD, BCPS []  Pollyann SamplesAndy Johnston, PharmD, BCPS  Positive urine culture Treated with Cephalexin, organism sensitive to the same and no further patient follow-up is required at this time.  Virl AxeRobertson, Taeshawn Helfman [facility] 09/28/2016, 11:13 AM

## 2016-10-08 ENCOUNTER — Emergency Department (HOSPITAL_COMMUNITY): Payer: Medicare Other

## 2016-10-08 ENCOUNTER — Emergency Department (HOSPITAL_COMMUNITY)
Admission: EM | Admit: 2016-10-08 | Discharge: 2016-10-08 | Disposition: A | Discharge status: Home / Self Care | Payer: Medicare Other | Attending: Emergency Medicine | Admitting: Emergency Medicine

## 2016-10-08 DIAGNOSIS — Y999 Unspecified external cause status: Secondary | ICD-10-CM | POA: Insufficient documentation

## 2016-10-08 DIAGNOSIS — I10 Essential (primary) hypertension: Secondary | ICD-10-CM | POA: Insufficient documentation

## 2016-10-08 DIAGNOSIS — S0990XA Unspecified injury of head, initial encounter: Secondary | ICD-10-CM | POA: Diagnosis not present

## 2016-10-08 DIAGNOSIS — Z7982 Long term (current) use of aspirin: Secondary | ICD-10-CM | POA: Diagnosis not present

## 2016-10-08 DIAGNOSIS — F0390 Unspecified dementia without behavioral disturbance: Secondary | ICD-10-CM | POA: Diagnosis not present

## 2016-10-08 DIAGNOSIS — W19XXXA Unspecified fall, initial encounter: Secondary | ICD-10-CM | POA: Diagnosis not present

## 2016-10-08 DIAGNOSIS — Z79899 Other long term (current) drug therapy: Secondary | ICD-10-CM | POA: Diagnosis not present

## 2016-10-08 DIAGNOSIS — Y929 Unspecified place or not applicable: Secondary | ICD-10-CM | POA: Diagnosis not present

## 2016-10-08 DIAGNOSIS — Y939 Activity, unspecified: Secondary | ICD-10-CM | POA: Insufficient documentation

## 2016-10-08 NOTE — ED Triage Notes (Signed)
[facility] EMS[facility]. Staff reports finding pt on the floor. EMS reports that pt was sitting in a chair on arrival with no complaints of injury. Pt was able to stand and pivot with EMS with NAD. Pt has hx of dementia and is alert to self at baseline. Per EMS pt is not on any anticoagulants.

## 2016-10-08 NOTE — ED Notes (Signed)
Pt unable to sign - due to AMS 

## 2016-10-08 NOTE — Discharge Instructions (Signed)
We saw you in the ER after you had a fall. °All the imaging results are normal, no fractures seen. No evidence of brain bleed. °Please be very careful with walking, and do everything possible to prevent falls. ° ° °

## 2016-10-08 NOTE — ED Notes (Signed)
Bed: [facility] Expected date:  Expected time:  Means of arrival:  Comments: 

## 2016-10-08 NOTE — ED Provider Notes (Signed)
[facility]-EMERGENCY DEPT Provider Note   CSN: [id number] Arrival date & time: 10/08/16  0840     History   Chief Complaint Chief Complaint  Patient presents with  . Fall    HPI Tara BellmanRuby Earleen ReaperM Henderson is a 71 y.o. female.  HPI Level 5 caveat for severe dementia.  Pt with dementia and multiple falls comes in with cc of unwitnessed fall. Pt was found on the floor.  Pt has no complains. Pt is not on blood thinners.   Past Medical History:  Diagnosis Date  . Dementia   . Hypercalcemia   . Hypertension   . Osteopetrosis   . Renal disorder     Patient Active Problem List   Diagnosis Date Noted  . Moderate dementia without behavioral disturbance 11/09/2013    No past surgical history on file.  OB History    No data available       Home Medications    Prior to Admission medications   Medication Sig Start Date End Date Taking? Authorizing Provider  acetaminophen (TYLENOL) 500 MG tablet Take 500 mg by mouth every 4 (four) hours as needed for mild pain.    [provider]  ALPRAZolam Prudy Feeler(XANAX) 0.25 MG tablet Take 0.25 mg by mouth daily as needed (agitation).    [provider]  alum & mag hydroxide-simeth (MAALOX PLUS) 400-400-40 MG/5ML suspension Take 15 mLs by mouth every 6 (six) hours as needed for indigestion.    [provider]  aspirin 81 MG chewable tablet Chew 81 mg by mouth daily.    [provider]  atorvastatin (LIPITOR) 20 MG tablet Take 20 mg by mouth daily.    [provider]  cholecalciferol (VITAMIN D) 1000 units tablet Take 2,000 Units by mouth daily.    [provider]  escitalopram (LEXAPRO) 10 MG tablet Take 10 mg by mouth daily. 09/20/16   [provider]  guaifenesin (ROBITUSSIN) 100 MG/5ML syrup Take 200 mg by mouth 3 (three) times daily as needed for cough.    [provider]  loperamide (IMODIUM) 2 MG capsule Take 2 mg by mouth as needed for diarrhea or loose stools.    [provider]  magnesium hydroxide (MILK OF MAGNESIA) 400 MG/5ML suspension Take 30 mLs by mouth at bedtime as needed for mild constipation.    [provider]  Memantine HCl ER (NAMENDA XR) 21 MG CP24 Take 1 capsule by mouth daily.    [provider]  neomycin-bacitracin-polymyxin (NEOSPORIN) [phone] ointment Apply 1 application topically as needed (skin tears).    [provider]  traZODone (DESYREL) 50 MG tablet Take 50 mg by mouth at bedtime.    [provider]    Family History Family History  Problem Relation Age of Onset  . Heart attack Mother   . Other Father        murdered    Social History Social History  Substance Use Topics  . Smoking status: Never Smoker  . Smokeless tobacco: Never Used  . Alcohol use No     Allergies   Patient has no known allergies.   Review of Systems Review of Systems  Unable to perform ROS: Dementia     Physical Exam Updated Vital Signs BP 124/74   Pulse (!) 58   Temp 98.3 F (36.8 C) (Oral)   Resp 16   SpO2 96%   Physical Exam  Constitutional: She is oriented to person, place, and time. She appears well-developed.  HENT:  Head:  Normocephalic and atraumatic.  Eyes: EOM are normal.  Neck: Normal range of motion. Neck supple.  No midline c-spine tenderness, pt able to turn head to 45 degrees bilaterally without any pain and able to flex neck to the chest and extend without any pain or neurologic symptoms.   Cardiovascular: Normal rate.   Pulmonary/Chest: Effort normal.  Abdominal: Bowel sounds are normal.  Musculoskeletal:  Head to toe evaluation shows no hematoma, bleeding of the scalp, no facial abrasions, no spine step offs, crepitus of the chest or neck, no tenderness to palpation of the bilateral upper and lower extremities, no gross deformities, no chest tenderness, no pelvic pain.   Neurological: She is alert and oriented to person, place, and time.  Skin: Skin is warm and dry.  Nursing  note and vitals reviewed.    ED Treatments / Results  Labs (all labs ordered are listed, but only abnormal results are displayed) Labs Reviewed - No data to display  EKG  EKG Interpretation None       Radiology Ct Head Wo Contrast  Result Date: 10/08/2016 CLINICAL DATA:  Status post fall today. EXAM: CT HEAD WITHOUT CONTRAST TECHNIQUE: Contiguous axial images were obtained from the base of the skull through the vertex without intravenous contrast. COMPARISON:  Head CT scan 09/24/2016. FINDINGS: Brain: Atrophy and chronic microvascular ischemic change are again seen. No hemorrhage, infarct, mass lesion, mass effect, midline shift or abnormal extra-axial fluid collection. No hydrocephalus or pneumocephalus. Vascular: Atherosclerosis noted. Skull: Intact. Sinuses/Orbits: Negative. Other: None. IMPRESSION: No acute abnormality. Atrophy and chronic microvascular ischemic change. Atherosclerosis. Electronically Signed   By: Drusilla Kanner Tara.D.   On: 10/08/2016 10:04    Procedures Procedures (including critical care time)  Medications Ordered in ED Medications - No data to display   Initial Impression / Assessment and Plan / ED Course  I have reviewed the triage vital signs and the nursing notes.  Pertinent labs & imaging results that were available during my care of the patient were reviewed by me and considered in my medical decision making (see chart for details).     DDx includes: - Mechanical falls - ICH - Fractures - Contusions - Soft tissue injury  Pt comes in after unwitnessed fall.  I feel comfortable clearing the cspine. We will get CT head. Pt able to bear weight for me, so no hip xrays needed. No deformity seen either.   Final Clinical Impressions(s) / ED Diagnoses   Final diagnoses:  Fall, initial encounter  Minor head injury, initial encounter    New Prescriptions New Prescriptions   No medications on file     Derwood Kaplan, MD 10/08/16 1044

## 2018-12-07 ENCOUNTER — Other Ambulatory Visit: Payer: Self-pay

## 2018-12-07 ENCOUNTER — Emergency Department (HOSPITAL_COMMUNITY): Payer: Medicare Other

## 2018-12-07 ENCOUNTER — Encounter (HOSPITAL_COMMUNITY): Payer: Self-pay | Admitting: Emergency Medicine

## 2018-12-07 ENCOUNTER — Inpatient Hospital Stay (HOSPITAL_COMMUNITY)
Admission: EM | Admit: 2018-12-07 | Discharge: 2018-12-11 | DRG: 481 | Disposition: A | Discharge status: Skilled Nursing Facility | Payer: Medicare Other | Source: Skilled Nursing Facility | Attending: Internal Medicine | Admitting: Internal Medicine

## 2018-12-07 DIAGNOSIS — S2232XA Fracture of one rib, left side, initial encounter for closed fracture: Secondary | ICD-10-CM | POA: Diagnosis not present

## 2018-12-07 DIAGNOSIS — T148XXA Other injury of unspecified body region, initial encounter: Secondary | ICD-10-CM | POA: Diagnosis not present

## 2018-12-07 DIAGNOSIS — Z888 Allergy status to other drugs, medicaments and biological substances status: Secondary | ICD-10-CM | POA: Diagnosis not present

## 2018-12-07 DIAGNOSIS — Z20828 Contact with and (suspected) exposure to other viral communicable diseases: Secondary | ICD-10-CM | POA: Diagnosis not present

## 2018-12-07 DIAGNOSIS — Y92129 Unspecified place in nursing home as the place of occurrence of the external cause: Secondary | ICD-10-CM | POA: Diagnosis not present

## 2018-12-07 DIAGNOSIS — Z7982 Long term (current) use of aspirin: Secondary | ICD-10-CM

## 2018-12-07 DIAGNOSIS — Z419 Encounter for procedure for purposes other than remedying health state, unspecified: Secondary | ICD-10-CM

## 2018-12-07 DIAGNOSIS — I10 Essential (primary) hypertension: Secondary | ICD-10-CM | POA: Diagnosis present

## 2018-12-07 DIAGNOSIS — S72141G Displaced intertrochanteric fracture of right femur, subsequent encounter for closed fracture with delayed healing: Secondary | ICD-10-CM | POA: Diagnosis not present

## 2018-12-07 DIAGNOSIS — E559 Vitamin D deficiency, unspecified: Secondary | ICD-10-CM | POA: Diagnosis present

## 2018-12-07 DIAGNOSIS — Z66 Do not resuscitate: Secondary | ICD-10-CM | POA: Diagnosis present

## 2018-12-07 DIAGNOSIS — R296 Repeated falls: Secondary | ICD-10-CM | POA: Diagnosis present

## 2018-12-07 DIAGNOSIS — S72141A Displaced intertrochanteric fracture of right femur, initial encounter for closed fracture: Secondary | ICD-10-CM | POA: Diagnosis not present

## 2018-12-07 DIAGNOSIS — W19XXXA Unspecified fall, initial encounter: Secondary | ICD-10-CM

## 2018-12-07 DIAGNOSIS — S72001A Fracture of unspecified part of neck of right femur, initial encounter for closed fracture: Secondary | ICD-10-CM | POA: Diagnosis not present

## 2018-12-07 DIAGNOSIS — D62 Acute posthemorrhagic anemia: Secondary | ICD-10-CM | POA: Diagnosis not present

## 2018-12-07 DIAGNOSIS — Z79899 Other long term (current) drug therapy: Secondary | ICD-10-CM | POA: Diagnosis not present

## 2018-12-07 DIAGNOSIS — F329 Major depressive disorder, single episode, unspecified: Secondary | ICD-10-CM | POA: Diagnosis present

## 2018-12-07 DIAGNOSIS — Z880 Allergy status to penicillin: Secondary | ICD-10-CM

## 2018-12-07 DIAGNOSIS — S0083XA Contusion of other part of head, initial encounter: Secondary | ICD-10-CM | POA: Diagnosis present

## 2018-12-07 DIAGNOSIS — R1310 Dysphagia, unspecified: Secondary | ICD-10-CM | POA: Diagnosis present

## 2018-12-07 DIAGNOSIS — M810 Age-related osteoporosis without current pathological fracture: Secondary | ICD-10-CM | POA: Diagnosis present

## 2018-12-07 DIAGNOSIS — Z8249 Family history of ischemic heart disease and other diseases of the circulatory system: Secondary | ICD-10-CM

## 2018-12-07 DIAGNOSIS — W1830XA Fall on same level, unspecified, initial encounter: Secondary | ICD-10-CM | POA: Diagnosis present

## 2018-12-07 DIAGNOSIS — S0990XA Unspecified injury of head, initial encounter: Secondary | ICD-10-CM | POA: Diagnosis not present

## 2018-12-07 DIAGNOSIS — E8889 Other specified metabolic disorders: Secondary | ICD-10-CM | POA: Diagnosis present

## 2018-12-07 DIAGNOSIS — F0390 Unspecified dementia without behavioral disturbance: Secondary | ICD-10-CM | POA: Diagnosis present

## 2018-12-07 HISTORY — DX: Other complications of anesthesia, initial encounter: T88.59XA

## 2018-12-07 HISTORY — DX: Other specified postprocedural states: Z98.890

## 2018-12-07 HISTORY — DX: Nausea with vomiting, unspecified: R11.2

## 2018-12-07 LAB — BASIC METABOLIC PANEL
Anion gap: 10 (ref 5–15)
BUN: 27 mg/dL — ABNORMAL HIGH (ref 8–23)
CO2: 26 mmol/L (ref 22–32)
Calcium: 8.8 mg/dL — ABNORMAL LOW (ref 8.9–10.3)
Chloride: 103 mmol/L (ref 98–111)
Creatinine, Ser: 1 mg/dL (ref 0.44–1.00)
GFR calc Af Amer: >60 mL/min (ref >60)
GFR calc non Af Amer: 55 mL/min — ABNORMAL LOW (ref >60)
Glucose, Bld: 166 mg/dL — ABNORMAL HIGH (ref 70–99)
Potassium: 4.7 mmol/L (ref 3.5–5.1)
Sodium: 139 mmol/L (ref 135–145)

## 2018-12-07 LAB — CBC WITH DIFFERENTIAL/PLATELET
Abs Immature Granulocytes: 0.07 10*3/uL (ref 0.00–0.07)
Basophils Absolute: 0 10*3/uL (ref 0.0–0.1)
Basophils Relative: 0 %
Eosinophils Absolute: 0.2 10*3/uL (ref 0.0–0.5)
Eosinophils Relative: 1 %
HCT: 39.6 % (ref 36.0–46.0)
Hemoglobin: 12.4 g/dL (ref 12.0–15.0)
Immature Granulocytes: 1 %
Lymphocytes Relative: 7 %
Lymphs Abs: 0.9 10*3/uL (ref 0.7–4.0)
MCH: 29.5 pg (ref 26.0–34.0)
MCHC: 31.3 g/dL (ref 30.0–36.0)
MCV: 94.3 fL (ref 80.0–100.0)
Monocytes Absolute: 0.7 10*3/uL (ref 0.1–1.0)
Monocytes Relative: 5 %
Neutro Abs: 11.8 10*3/uL — ABNORMAL HIGH (ref 1.7–7.7)
Neutrophils Relative %: 86 %
Platelets: 276 10*3/uL (ref 150–400)
RBC: 4.2 MIL/uL (ref 3.87–5.11)
RDW: 12.4 % (ref 11.5–15.5)
WBC: 13.6 10*3/uL — ABNORMAL HIGH (ref 4.0–10.5)
nRBC: 0 % (ref 0.0–0.2)

## 2018-12-07 LAB — PROTIME-INR
INR: 1.1 (ref 0.8–1.2)
Prothrombin Time: 13.7 seconds (ref 11.4–15.2)

## 2018-12-07 LAB — ABO/RH: ABO/RH(D): O POS

## 2018-12-07 LAB — SARS CORONAVIRUS 2 BY RT PCR (HOSPITAL ORDER, PERFORMED IN ~~LOC~~ HOSPITAL LAB): SARS Coronavirus 2: NEGATIVE

## 2018-12-07 MED ORDER — ASPIRIN 81 MG PO CHEW
81.0000 mg | CHEWABLE_TABLET | Freq: Every day | ORAL | Status: DC
  Administered 2018-12-10 – 2018-12-11 (×2): 81 mg via ORAL
  Filled 2018-12-07 (×3): qty 1

## 2018-12-07 MED ORDER — ATORVASTATIN CALCIUM 10 MG PO TABS
20.0000 mg | ORAL_TABLET | Freq: Every day | ORAL | Status: DC
  Administered 2018-12-08 – 2018-12-11 (×3): 20 mg via ORAL
  Filled 2018-12-07 (×4): qty 2

## 2018-12-07 MED ORDER — ESCITALOPRAM OXALATE 10 MG PO TABS
10.0000 mg | ORAL_TABLET | Freq: Every day | ORAL | Status: DC
  Administered 2018-12-08 – 2018-12-11 (×3): 10 mg via ORAL
  Filled 2018-12-07 (×4): qty 1

## 2018-12-07 MED ORDER — POLYETHYLENE GLYCOL 3350 17 G PO PACK: 17.0000 g | PACK | Freq: Every day | ORAL | Status: DC | PRN

## 2018-12-07 MED ORDER — DOCUSATE SODIUM 100 MG PO CAPS
100.0000 mg | ORAL_CAPSULE | Freq: Two times a day (BID) | ORAL | Status: DC
  Filled 2018-12-07: qty 1

## 2018-12-07 MED ORDER — SENNA 8.6 MG PO TABS
1.0000 | ORAL_TABLET | Freq: Two times a day (BID) | ORAL | Status: DC
  Administered 2018-12-08 – 2018-12-11 (×6): 8.6 mg via ORAL
  Filled 2018-12-07 (×8): qty 1

## 2018-12-07 MED ORDER — ACETAMINOPHEN 325 MG PO TABS
650.0000 mg | ORAL_TABLET | Freq: Four times a day (QID) | ORAL | Status: DC
  Administered 2018-12-08 – 2018-12-11 (×7): 650 mg via ORAL
  Filled 2018-12-07 (×9): qty 2

## 2018-12-07 MED ORDER — OXYCODONE HCL 5 MG PO TABS: 2.5000 mg | ORAL_TABLET | ORAL | Status: DC | PRN

## 2018-12-07 MED ORDER — HEPARIN SODIUM (PORCINE) 5000 UNIT/ML IJ SOLN
5000.0000 [IU] | Freq: Three times a day (TID) | INTRAMUSCULAR | Status: DC
  Administered 2018-12-07 – 2018-12-08 (×2): 5000 [IU] via SUBCUTANEOUS
  Filled 2018-12-07 (×2): qty 1

## 2018-12-07 MED ORDER — CEFAZOLIN SODIUM-DEXTROSE 2-4 GM/100ML-% IV SOLN
2.0000 g | INTRAVENOUS | Status: AC
  Administered 2018-12-08: 2 g via INTRAVENOUS
  Filled 2018-12-07 (×2): qty 100

## 2018-12-07 MED ORDER — VITAMIN D 25 MCG (1000 UNIT) PO TABS
2000.0000 [IU] | ORAL_TABLET | Freq: Every day | ORAL | Status: DC
  Administered 2018-12-08: 12:00:00 2000 [IU] via ORAL
  Filled 2018-12-07 (×3): qty 2

## 2018-12-07 MED ORDER — CALCIUM CARBONATE 1250 (500 CA) MG PO TABS
1.0000 | ORAL_TABLET | Freq: Every day | ORAL | Status: DC
  Administered 2018-12-08 – 2018-12-11 (×3): 500 mg via ORAL
  Filled 2018-12-07 (×5): qty 1

## 2018-12-07 MED ORDER — MEMANTINE HCL ER 7 MG PO CP24
21.0000 mg | ORAL_CAPSULE | Freq: Every day | ORAL | Status: DC
  Administered 2018-12-10: 21 mg via ORAL
  Filled 2018-12-07 (×5): qty 1

## 2018-12-07 NOTE — ED Notes (Signed)
Hoyt Koch calling from [place] [place] she is med tech asking for a call back needing to know if patient is going to have surgery or not, please call [phone]

## 2018-12-07 NOTE — Progress Notes (Signed)
Orthopaedic Trauma Progress Note  Called patient's daughter Tara Henderson) to obtain verbal consent for intramedullary nail of right femur scheduled with Dr. Marcelino Scot for tomorrow morning.   Risks and benefits of procedure were discussed with daughter. Risks discussed included bleeding requiring blood transfusion, bleeding causing a hematoma, infection, malunion, nonunion, damage to surrounding nerves and blood vessels, pain, hardware prominence or irritation, hardware failure, stiffness, post-traumatic arthritis, DVT/PE, compartment syndrome, and even anesthesia complications. The patient's daughter states her understanding of these risks and agrees to proceed with surgery. All questions were answered, verbal consent obtained.     Tara Henderson A. Tara [facility] ?[phone]? (phone)

## 2018-12-07 NOTE — ED Notes (Signed)
This RN spoke with Hoyt Koch, RN at [facility] gave update on pt status and plan to admit.

## 2018-12-07 NOTE — Consult Note (Signed)
Ortho Trauma Note  Reviewed case with Dr. Laverta Baltimore. 73 yo w/ dementia with displaced right intertrochanteric femur fracture. Plan for cephalomedullary nailing with my partner Dr. Marcelino Scot tomorrow AM. NPO past midnight. Will contact daughter for consent this evening. Formal consult to follow tomorrow AM.  Shona Needles, MD [facility] [phone] (phone) [phone] (office) [email or web address]

## 2018-12-07 NOTE — ED Triage Notes (Signed)
Per EMS- called out to [place] where pt lives. Pt has hx of dementia. At baseline she is drowsy but wakes up. Pt had a fall 3 days ago and had xrays to the right leg right knee and right hip. Facility reports she fell again today, has bruising to right eye/forehead area. Pt has swelling and deformity to right hip. Pt is guarding her hip. EMS report the patient was completely unresponsive upon arrival. Pt is awake now.

## 2018-12-07 NOTE — ED Provider Notes (Signed)
Emergency Department Provider Note   I have reviewed the triage vital signs and the nursing notes.   HISTORY  Chief Complaint Hip Injury and Fall   HPI Tara Henderson is a 73 y.o. female with past medical history of elevated blood pressure and dementia presents to the emergency department from the [facility] in [place], [place].  According to staff the patient had a fall 3 days ago and appeared to have some right leg pain at that time.  She reportedly had x-rays to the right hip and knee which were normal.  She also developed some bruising and swelling over the right forehead.  The patient had a second fall today with apparent worsening of pain in the right hip area.  Staff noted some swelling and called EMS.  According to EMS, on arrival the patient was not responding but became awake and alert in route.  Due to this, she was transported to [facility] ED.   Level 5 caveat: Dementia  Past Medical History:  Diagnosis Date   Dementia ([place])    Hypercalcemia    Hypertension    Osteopetrosis    Renal disorder     Patient Active Problem List   Diagnosis Date Noted   Moderate dementia without behavioral disturbance ([place]) 11/09/2013    History reviewed. No pertinent surgical history.  Allergies Patient has no known allergies.  Family History  Problem Relation Age of Onset   Heart attack Mother    Other Father        murdered    Social History Social History   Tobacco Use   Smoking status: Never Smoker   Smokeless tobacco: Never Used  Substance Use Topics   Alcohol use: No   Drug use: No    Review of Systems  Level 5 caveat: Dementia   ____________________________________________   PHYSICAL EXAM:  VITAL SIGNS: ED Triage Vitals [12/07/18 1736]  Enc Vitals Group     BP      Pulse Rate 69     Resp 19     Temp (!) 97.4 F (36.3 C)     Temp Source Tympanic     SpO2 100 %   Constitutional: Alert. No acute distress.  Eyes: Conjunctivae are  normal.  Head: Atraumatic. Nose: No congestion/rhinnorhea. Mouth/Throat: Mucous membranes are moist.  Oropharynx non-erythematous. Neck: No stridor. No step-off or deformity to palpation of the c-spine.  Cardiovascular: Normal rate, regular rhythm. Good peripheral circulation. Grossly normal heart sounds.   Respiratory: Normal respiratory effort.  No retractions. Lungs CTAB. Gastrointestinal: Soft and nontender. No distention.  Musculoskeletal: Right hip tenderness and guarding with attempted ROM. Proximal swelling noted. No deformity of the knee or ankle.  Neurologic: Moving all extremities equally with the exception of the RLE likely 2/2 pain from injury.  Skin:  Skin is warm, dry and intact. No rash noted.  ____________________________________________   LABS (all labs ordered are listed, but only abnormal results are displayed)  Labs Reviewed  BASIC METABOLIC PANEL - Abnormal; Notable for the following components:      Result Value   Glucose, Bld 166 (*)    BUN 27 (*)    Calcium 8.8 (*)    GFR calc non Af Amer 55 (*)    All other components within normal limits  CBC WITH DIFFERENTIAL/PLATELET - Abnormal; Notable for the following components:   WBC 13.6 (*)    Neutro Abs 11.8 (*)    All other components within normal limits  SARS CORONAVIRUS 2 (HOSPITAL ORDER, PERFORMED IN [facility])  PROTIME-INR  TYPE AND SCREEN  ABO/RH   ____________________________________________  EKG   EKG Interpretation  Date/Time:  Sunday December 07 2018 19:20:11 EDT Ventricular Rate:  81 PR Interval:    QRS Duration: 84 QT Interval:  388 QTC Calculation: 451 R Axis:   95 Text Interpretation:  Sinus rhythm Consider left atrial enlargement Right axis deviation No STEMI  Confirmed by Alona BeneLong, Anaisha Mago [phone]([id number]) on 12/07/2018 7:38:02 PM       ____________________________________________  RADIOLOGY  Dg Chest 1 View  Result Date: 12/07/2018 CLINICAL DATA:  Preop.  Pain status post  fall EXAM: CHEST  1 VIEW COMPARISON:  02/16/2016 FINDINGS: There are chronic lung changes at the lung bases bilaterally favored to represent atelectasis or scarring. There is likely a background of emphysematous changes. There is no pneumothorax. No large pleural effusion. The heart size is normal. Aortic calcifications are noted. IMPRESSION: 1. No acute cardiopulmonary process identified. 2. Probable underlying emphysema. Electronically Signed   By: Katherine Mantlehristopher  Green M.D.   On: 12/07/2018 18:39   Dg Knee 2 Views Right  Result Date: 12/07/2018 CLINICAL DATA:  Right leg pain EXAM: RIGHT KNEE - 1-2 VIEW COMPARISON:  None. FINDINGS: There is no acute displaced fracture. No dislocation. Mild degenerative changes are noted. There is no significant joint effusion. IMPRESSION: No acute osseous abnormality detected. Electronically Signed   By: Katherine Mantlehristopher  Green M.D.   On: 12/07/2018 18:35   Ct Head Wo Contrast  Result Date: 12/07/2018 CLINICAL DATA:  Acute pain due to trauma EXAM: CT HEAD WITHOUT CONTRAST CT MAXILLOFACIAL WITHOUT CONTRAST CT CERVICAL SPINE WITHOUT CONTRAST TECHNIQUE: Multidetector CT imaging of the head, cervical spine, and maxillofacial structures were performed using the standard protocol without intravenous contrast. Multiplanar CT image reconstructions of the cervical spine and maxillofacial structures were also generated. COMPARISON:  CT head dated June 11, 2018 FINDINGS: CT HEAD FINDINGS Brain: No evidence of acute infarction, hemorrhage, hydrocephalus, extra-axial collection or mass lesion/mass effect. Again noted is global atrophy and chronic microvascular ischemic changes. Vascular: No hyperdense vessel or unexpected calcification. Skull: Normal. Negative for fracture or focal lesion. There is frontal scalp swelling without evidence for an underlying fracture. Other: None. CT MAXILLOFACIAL FINDINGS Osseous: No fracture or mandibular dislocation. No destructive process. Orbits: Negative.  No traumatic or inflammatory finding. Sinuses: Clear. Soft tissues: There is right frontal and right periorbital soft tissue swelling without evidence for an underlying fracture. CT CERVICAL SPINE FINDINGS Alignment: Normal. Skull base and vertebrae: No acute fracture. No primary bone lesion or focal pathologic process. Soft tissues and spinal canal: No prevertebral fluid or swelling. No visible canal hematoma. Disc levels: There is multilevel disc height loss throughout the cervical spine, greatest at the lower cervical levels. Upper chest: There may be a fracture of the posterior fourth rib on the right. This is only partially visualized. Other: None IMPRESSION: 1. No acute intracranial abnormality. 2. No facial fracture. 3. No acute cervical spine fracture. 4. Right frontal and periorbital soft tissue swelling without evidence for a fracture. 5. Possible nondisplaced fracture involving the posterior fourth rib on the right. This is only partially visualized. Electronically Signed   By: Katherine Mantlehristopher  Green M.D.   On: 12/07/2018 19:37   Ct Cervical Spine Wo Contrast  Result Date: 12/07/2018 CLINICAL DATA:  Acute pain due to trauma EXAM: CT HEAD WITHOUT CONTRAST CT MAXILLOFACIAL WITHOUT CONTRAST CT CERVICAL SPINE WITHOUT CONTRAST TECHNIQUE: Multidetector CT imaging of the head,  cervical spine, and maxillofacial structures were performed using the standard protocol without intravenous contrast. Multiplanar CT image reconstructions of the cervical spine and maxillofacial structures were also generated. COMPARISON:  CT head dated June 11, 2018 FINDINGS: CT HEAD FINDINGS Brain: No evidence of acute infarction, hemorrhage, hydrocephalus, extra-axial collection or mass lesion/mass effect. Again noted is global atrophy and chronic microvascular ischemic changes. Vascular: No hyperdense vessel or unexpected calcification. Skull: Normal. Negative for fracture or focal lesion. There is frontal scalp swelling without  evidence for an underlying fracture. Other: None. CT MAXILLOFACIAL FINDINGS Osseous: No fracture or mandibular dislocation. No destructive process. Orbits: Negative. No traumatic or inflammatory finding. Sinuses: Clear. Soft tissues: There is right frontal and right periorbital soft tissue swelling without evidence for an underlying fracture. CT CERVICAL SPINE FINDINGS Alignment: Normal. Skull base and vertebrae: No acute fracture. No primary bone lesion or focal pathologic process. Soft tissues and spinal canal: No prevertebral fluid or swelling. No visible canal hematoma. Disc levels: There is multilevel disc height loss throughout the cervical spine, greatest at the lower cervical levels. Upper chest: There may be a fracture of the posterior fourth rib on the right. This is only partially visualized. Other: None IMPRESSION: 1. No acute intracranial abnormality. 2. No facial fracture. 3. No acute cervical spine fracture. 4. Right frontal and periorbital soft tissue swelling without evidence for a fracture. 5. Possible nondisplaced fracture involving the posterior fourth rib on the right. This is only partially visualized. Electronically Signed   By: Katherine Mantlehristopher  Green M.D.   On: 12/07/2018 19:37   Dg Hip Unilat W Or Wo Pelvis 2-3 Views Right  Result Date: 12/07/2018 CLINICAL DATA:  Pain EXAM: DG HIP (WITH OR WITHOUT PELVIS) 2-3V RIGHT COMPARISON:  None. FINDINGS: There is an acute displaced comminuted intratrochanteric fracture of the proximal right femur. There is no dislocation. There are degenerative changes of both hips. IMPRESSION: Acute intratrochanteric fracture of the right femur. Electronically Signed   By: Katherine Mantlehristopher  Green M.D.   On: 12/07/2018 18:34   Ct Maxillofacial Wo Contrast  Result Date: 12/07/2018 CLINICAL DATA:  Acute pain due to trauma EXAM: CT HEAD WITHOUT CONTRAST CT MAXILLOFACIAL WITHOUT CONTRAST CT CERVICAL SPINE WITHOUT CONTRAST TECHNIQUE: Multidetector CT imaging of the head,  cervical spine, and maxillofacial structures were performed using the standard protocol without intravenous contrast. Multiplanar CT image reconstructions of the cervical spine and maxillofacial structures were also generated. COMPARISON:  CT head dated June 11, 2018 FINDINGS: CT HEAD FINDINGS Brain: No evidence of acute infarction, hemorrhage, hydrocephalus, extra-axial collection or mass lesion/mass effect. Again noted is global atrophy and chronic microvascular ischemic changes. Vascular: No hyperdense vessel or unexpected calcification. Skull: Normal. Negative for fracture or focal lesion. There is frontal scalp swelling without evidence for an underlying fracture. Other: None. CT MAXILLOFACIAL FINDINGS Osseous: No fracture or mandibular dislocation. No destructive process. Orbits: Negative. No traumatic or inflammatory finding. Sinuses: Clear. Soft tissues: There is right frontal and right periorbital soft tissue swelling without evidence for an underlying fracture. CT CERVICAL SPINE FINDINGS Alignment: Normal. Skull base and vertebrae: No acute fracture. No primary bone lesion or focal pathologic process. Soft tissues and spinal canal: No prevertebral fluid or swelling. No visible canal hematoma. Disc levels: There is multilevel disc height loss throughout the cervical spine, greatest at the lower cervical levels. Upper chest: There may be a fracture of the posterior fourth rib on the right. This is only partially visualized. Other: None IMPRESSION: 1. No acute intracranial abnormality. 2. No  facial fracture. 3. No acute cervical spine fracture. 4. Right frontal and periorbital soft tissue swelling without evidence for a fracture. 5. Possible nondisplaced fracture involving the posterior fourth rib on the right. This is only partially visualized. Electronically Signed   By: Katherine Mantle M.D.   On: 12/07/2018 19:37    ____________________________________________   PROCEDURES  Procedure(s)  performed:   Procedures  None  ____________________________________________   INITIAL IMPRESSION / ASSESSMENT AND PLAN / ED COURSE  Pertinent labs & imaging results that were available during my care of the patient were reviewed by me and considered in my medical decision making (see chart for details).   Patient presents to the emergency department from skilled nursing facility after multiple falls in the past 3 days.  After the most recent fall today she developed was found to have worsening pain in the right leg area.  Suspect fracture clinically with some proximal swelling and exquisite tenderness with attempted range of motion.  Palpable pulses are noted on exam.  Plan for CT imaging of the head, cervical spine, face.   CT imaging and plain films reviewed.  Patient with right intertrochanteric hip fracture.  Discussed with Dr. Jena Gauss who will evaluate and try to fit in for surgery tomorrow.  I called the patient's daughter listed in the emergency contact section, Daisy.  I updated her on the situation and plan for admit and surgery.   Discussed patient's case with [facility], Dr. Arlean Hopping to request admission. Patient and family (if present) updated with plan. Care transferred to [facility] service.  I reviewed all nursing notes, vitals, pertinent old records, EKGs, labs, imaging (as available).  ____________________________________________  FINAL CLINICAL IMPRESSION(S) / ED DIAGNOSES  Final diagnoses:  Closed fracture of right hip, initial encounter [facility])  Fall, initial encounter  Injury of head, initial encounter    Note:  This document was prepared using Dragon voice recognition software and may include unintentional dictation errors.  Alona Bene, MD Emergency Medicine    Elvan Ebron, Arlyss Repress, MD 12/07/18 2008

## 2018-12-07 NOTE — H&P (Signed)
History and Physical    Tara Henderson LUN:[id number] DOB: 09-Oct-1945 DOA: 12/07/2018  PCP: Ron Parker, MD Patient coming from: [facility]  I have personally briefly reviewed patient's old medical records in [facility]  Chief Complaint: Fall with right hip fracture  HPI: Tara Henderson is a 73 y.o. female with medical history significant for dementia and hypertension who presented to the ED from her skilled nursing facility after 2 recent falls former which occurred 3 days ago in the most recent occurred on the day of presentation.  After today's fall, she had noticeable swelling and bruising of the right forehead and swelling deformity of the right hip.  EMS reported the patient was altered from her baseline with increased somnolence initially, however that has resolved since presentation.  Patient is unable to give any significant history at this time.  She endorses pain in her right hip and otherwise did not express any complaints.  After speaking with her daughter, Tara Reil, Ms. Haushalter endorsed hip pain after her initial fall on Friday and was in a wheelchair subsequently.  ED Course: In the ED, patient afebrile, hemodynamically stable, on 2 L supplemental oxygen with saturations in the upper 90s.  Imaging notable for acute intertrochanteric right femoral fracture.  CT head/face/C-spine showed probable nondisplaced posterior fourth rib fracture on the right, and soft tissue swelling around the right facial bone, without acute fracture of the face or C-spine noted.  Chest x-ray showed no acute abnormality.  Orthopedic surgery was consulted in the emergency department with plans for operative intervention.  Review of Systems: As per HPI otherwise 10 point review of systems negative.   Past Medical History:  Diagnosis Date   Dementia (HCC)    Hypercalcemia    Hypertension    Osteopetrosis    Renal disorder     History reviewed. No pertinent surgical history.   reports that she has never smoked. She has never used smokeless tobacco. She reports that she does not drink alcohol or use drugs.  No Known Allergies  Family History  Problem Relation Age of Onset   Heart attack Mother    Other Father        murdered    Prior to Admission medications   Medication Sig Start Date End Date Taking? Authorizing Provider  acetaminophen (TYLENOL) 500 MG tablet Take 500 mg by mouth every 4 (four) hours as needed for mild pain.    [provider]  ALPRAZolam Prudy Feeler) 0.25 MG tablet Take 0.25 mg by mouth daily as needed (agitation).    [provider]  alum & mag hydroxide-simeth (MAALOX PLUS) 400-400-40 MG/5ML suspension Take 15 mLs by mouth every 6 (six) hours as needed for indigestion.    [provider]  aspirin 81 MG chewable tablet Chew 81 mg by mouth daily.    [provider]  atorvastatin (LIPITOR) 20 MG tablet Take 20 mg by mouth daily.    [provider]  cholecalciferol (VITAMIN D) 1000 units tablet Take 2,000 Units by mouth daily.    [provider]  escitalopram (LEXAPRO) 10 MG tablet Take 10 mg by mouth daily. 09/20/16   [provider]  guaifenesin (ROBITUSSIN) 100 MG/5ML syrup Take 200 mg by mouth 3 (three) times daily as needed for cough.    [provider]  loperamide (IMODIUM) 2 MG capsule Take 2 mg by mouth as needed for diarrhea or loose stools.    [provider]  magnesium hydroxide (MILK OF  MAGNESIA) 400 MG/5ML suspension Take 30 mLs by mouth at bedtime as needed for mild constipation.    [provider]  Memantine HCl ER (NAMENDA XR) 21 MG CP24 Take 1 capsule by mouth daily.    [provider]  neomycin-bacitracin-polymyxin (NEOSPORIN) [phone] ointment Apply 1 application topically as needed (skin tears).    [provider]  traZODone (DESYREL) 50 MG tablet Take 50 mg by mouth at bedtime.    [provider]    Physical  Exam: Vitals:   12/07/18 1735 12/07/18 1736 12/07/18 1930 12/07/18 2000  BP: (!) 134/119  (!) 129/110 130/79  Pulse: 73 69 81 82  Resp:  19 18 (!) 21  Temp:  (!) 97.4 F (36.3 C)    TempSrc:  Tympanic    SpO2: 100% 100% 93% 91%    Constitutional: NAD, calm, comfortable Eyes: PERRL, bruising and swelling above right orbit ENMT: Mucous membranes are moist. Posterior pharynx clear of any exudate or lesions. Poor dentition Neck: normal, supple, no masses Respiratory: clear to auscultation bilaterally, no wheezing, no crackles. Normal respiratory effort. No accessory muscle use.  Cardiovascular: Regular rate and rhythm, no murmurs / rubs / gallops. No extremity edema. 2+ pedal pulses. No carotid bruits.  Abdomen: no tenderness, no masses palpated. No hepatosplenomegaly. Bowel sounds positive.  Musculoskeletal: no clubbing / cyanosis.  Right lower extremity is mildly shortened and externally rotated Skin: no rashes, lesions, ulcers. No induration Neurologic: CN 2-12 grossly intact. Sensation intact. Strength grossly normal in upper extremities. Psychiatric: Oriented only to self, easily arousable and communicative   Labs on Admission: I have personally reviewed following labs and imaging studies  CBC: Recent Labs  Lab 12/07/18 1839  WBC 13.6*  NEUTROABS 11.8*  HGB 12.4  HCT 39.6  MCV 94.3  PLT 276   Basic Metabolic Panel: Recent Labs  Lab 12/07/18 1839  NA 139  K 4.7  CL 103  CO2 26  GLUCOSE 166*  BUN 27*  CREATININE 1.00  CALCIUM 8.8*   GFR: CrCl cannot be calculated (Unknown ideal weight.). Liver Function Tests: No results for input(s): AST, ALT, ALKPHOS, BILITOT, PROT, ALBUMIN in the last 168 hours. No results for input(s): LIPASE, AMYLASE in the last 168 hours. No results for input(s): AMMONIA in the last 168 hours. Coagulation Profile: Recent Labs  Lab 12/07/18 1839  INR 1.1   Cardiac Enzymes: No results for input(s): CKTOTAL, CKMB, CKMBINDEX, TROPONINI  in the last 168 hours. BNP (last 3 results) No results for input(s): PROBNP in the last 8760 hours. HbA1C: No results for input(s): HGBA1C in the last 72 hours. CBG: No results for input(s): GLUCAP in the last 168 hours. Lipid Profile: No results for input(s): CHOL, HDL, LDLCALC, TRIG, CHOLHDL, LDLDIRECT in the last 72 hours. Thyroid Function Tests: No results for input(s): TSH, T4TOTAL, FREET4, T3FREE, THYROIDAB in the last 72 hours. Anemia Panel: No results for input(s): VITAMINB12, FOLATE, FERRITIN, TIBC, IRON, RETICCTPCT in the last 72 hours. Urine analysis:    Component Value Date/Time   COLORURINE YELLOW 09/24/2016 1644   APPEARANCEUR CLEAR 09/24/2016 1644   LABSPEC 1.021 09/24/2016 1644   PHURINE 5.0 09/24/2016 1644   GLUCOSEU NEGATIVE 09/24/2016 1644   HGBUR NEGATIVE 09/24/2016 1644   BILIRUBINUR NEGATIVE 09/24/2016 1644   KETONESUR NEGATIVE 09/24/2016 1644   PROTEINUR NEGATIVE 09/24/2016 1644   NITRITE NEGATIVE 09/24/2016 1644   LEUKOCYTESUR TRACE (A) 09/24/2016 1644    Radiological Exams on Admission: Dg Chest 1 View  Result Date: 12/07/2018  CLINICAL DATA:  Preop.  Pain status post fall EXAM: CHEST  1 VIEW COMPARISON:  02/16/2016 FINDINGS: There are chronic lung changes at the lung bases bilaterally favored to represent atelectasis or scarring. There is likely a background of emphysematous changes. There is no pneumothorax. No large pleural effusion. The heart size is normal. Aortic calcifications are noted. IMPRESSION: 1. No acute cardiopulmonary process identified. 2. Probable underlying emphysema. Electronically Signed   By: Katherine Mantlehristopher  Green M.D.   On: 12/07/2018 18:39   Dg Knee 2 Views Right  Result Date: 12/07/2018 CLINICAL DATA:  Right leg pain EXAM: RIGHT KNEE - 1-2 VIEW COMPARISON:  None. FINDINGS: There is no acute displaced fracture. No dislocation. Mild degenerative changes are noted. There is no significant joint effusion. IMPRESSION: No acute osseous  abnormality detected. Electronically Signed   By: Katherine Mantlehristopher  Green M.D.   On: 12/07/2018 18:35   Ct Head Wo Contrast  Result Date: 12/07/2018 CLINICAL DATA:  Acute pain due to trauma EXAM: CT HEAD WITHOUT CONTRAST CT MAXILLOFACIAL WITHOUT CONTRAST CT CERVICAL SPINE WITHOUT CONTRAST TECHNIQUE: Multidetector CT imaging of the head, cervical spine, and maxillofacial structures were performed using the standard protocol without intravenous contrast. Multiplanar CT image reconstructions of the cervical spine and maxillofacial structures were also generated. COMPARISON:  CT head dated June 11, 2018 FINDINGS: CT HEAD FINDINGS Brain: No evidence of acute infarction, hemorrhage, hydrocephalus, extra-axial collection or mass lesion/mass effect. Again noted is global atrophy and chronic microvascular ischemic changes. Vascular: No hyperdense vessel or unexpected calcification. Skull: Normal. Negative for fracture or focal lesion. There is frontal scalp swelling without evidence for an underlying fracture. Other: None. CT MAXILLOFACIAL FINDINGS Osseous: No fracture or mandibular dislocation. No destructive process. Orbits: Negative. No traumatic or inflammatory finding. Sinuses: Clear. Soft tissues: There is right frontal and right periorbital soft tissue swelling without evidence for an underlying fracture. CT CERVICAL SPINE FINDINGS Alignment: Normal. Skull base and vertebrae: No acute fracture. No primary bone lesion or focal pathologic process. Soft tissues and spinal canal: No prevertebral fluid or swelling. No visible canal hematoma. Disc levels: There is multilevel disc height loss throughout the cervical spine, greatest at the lower cervical levels. Upper chest: There may be a fracture of the posterior fourth rib on the right. This is only partially visualized. Other: None IMPRESSION: 1. No acute intracranial abnormality. 2. No facial fracture. 3. No acute cervical spine fracture. 4. Right frontal and periorbital  soft tissue swelling without evidence for a fracture. 5. Possible nondisplaced fracture involving the posterior fourth rib on the right. This is only partially visualized. Electronically Signed   By: Katherine Mantlehristopher  Green M.D.   On: 12/07/2018 19:37   Ct Cervical Spine Wo Contrast  Result Date: 12/07/2018 CLINICAL DATA:  Acute pain due to trauma EXAM: CT HEAD WITHOUT CONTRAST CT MAXILLOFACIAL WITHOUT CONTRAST CT CERVICAL SPINE WITHOUT CONTRAST TECHNIQUE: Multidetector CT imaging of the head, cervical spine, and maxillofacial structures were performed using the standard protocol without intravenous contrast. Multiplanar CT image reconstructions of the cervical spine and maxillofacial structures were also generated. COMPARISON:  CT head dated June 11, 2018 FINDINGS: CT HEAD FINDINGS Brain: No evidence of acute infarction, hemorrhage, hydrocephalus, extra-axial collection or mass lesion/mass effect. Again noted is global atrophy and chronic microvascular ischemic changes. Vascular: No hyperdense vessel or unexpected calcification. Skull: Normal. Negative for fracture or focal lesion. There is frontal scalp swelling without evidence for an underlying fracture. Other: None. CT MAXILLOFACIAL FINDINGS Osseous: No fracture or mandibular dislocation. No destructive  process. Orbits: Negative. No traumatic or inflammatory finding. Sinuses: Clear. Soft tissues: There is right frontal and right periorbital soft tissue swelling without evidence for an underlying fracture. CT CERVICAL SPINE FINDINGS Alignment: Normal. Skull base and vertebrae: No acute fracture. No primary bone lesion or focal pathologic process. Soft tissues and spinal canal: No prevertebral fluid or swelling. No visible canal hematoma. Disc levels: There is multilevel disc height loss throughout the cervical spine, greatest at the lower cervical levels. Upper chest: There may be a fracture of the posterior fourth rib on the right. This is only partially  visualized. Other: None IMPRESSION: 1. No acute intracranial abnormality. 2. No facial fracture. 3. No acute cervical spine fracture. 4. Right frontal and periorbital soft tissue swelling without evidence for a fracture. 5. Possible nondisplaced fracture involving the posterior fourth rib on the right. This is only partially visualized. Electronically Signed   By: Katherine Mantlehristopher  Green M.D.   On: 12/07/2018 19:37   Dg Hip Unilat W Or Wo Pelvis 2-3 Views Right  Result Date: 12/07/2018 CLINICAL DATA:  Pain EXAM: DG HIP (WITH OR WITHOUT PELVIS) 2-3V RIGHT COMPARISON:  None. FINDINGS: There is an acute displaced comminuted intratrochanteric fracture of the proximal right femur. There is no dislocation. There are degenerative changes of both hips. IMPRESSION: Acute intratrochanteric fracture of the right femur. Electronically Signed   By: Katherine Mantlehristopher  Green M.D.   On: 12/07/2018 18:34   Ct Maxillofacial Wo Contrast  Result Date: 12/07/2018 CLINICAL DATA:  Acute pain due to trauma EXAM: CT HEAD WITHOUT CONTRAST CT MAXILLOFACIAL WITHOUT CONTRAST CT CERVICAL SPINE WITHOUT CONTRAST TECHNIQUE: Multidetector CT imaging of the head, cervical spine, and maxillofacial structures were performed using the standard protocol without intravenous contrast. Multiplanar CT image reconstructions of the cervical spine and maxillofacial structures were also generated. COMPARISON:  CT head dated June 11, 2018 FINDINGS: CT HEAD FINDINGS Brain: No evidence of acute infarction, hemorrhage, hydrocephalus, extra-axial collection or mass lesion/mass effect. Again noted is global atrophy and chronic microvascular ischemic changes. Vascular: No hyperdense vessel or unexpected calcification. Skull: Normal. Negative for fracture or focal lesion. There is frontal scalp swelling without evidence for an underlying fracture. Other: None. CT MAXILLOFACIAL FINDINGS Osseous: No fracture or mandibular dislocation. No destructive process. Orbits:  Negative. No traumatic or inflammatory finding. Sinuses: Clear. Soft tissues: There is right frontal and right periorbital soft tissue swelling without evidence for an underlying fracture. CT CERVICAL SPINE FINDINGS Alignment: Normal. Skull base and vertebrae: No acute fracture. No primary bone lesion or focal pathologic process. Soft tissues and spinal canal: No prevertebral fluid or swelling. No visible canal hematoma. Disc levels: There is multilevel disc height loss throughout the cervical spine, greatest at the lower cervical levels. Upper chest: There may be a fracture of the posterior fourth rib on the right. This is only partially visualized. Other: None IMPRESSION: 1. No acute intracranial abnormality. 2. No facial fracture. 3. No acute cervical spine fracture. 4. Right frontal and periorbital soft tissue swelling without evidence for a fracture. 5. Possible nondisplaced fracture involving the posterior fourth rib on the right. This is only partially visualized. Electronically Signed   By: Katherine Mantlehristopher  Green M.D.   On: 12/07/2018 19:37    EKG: Independently reviewed.  Likely sinus rhythm with rate of 81 and right axis deviation, however baseline tracing does show an additional wave in the lateral leads which raises concern for atrial flutter, will obtain new EKG to reassess  Assessment/Plan Active Problems:   Displaced intertrochanteric fracture  of right femur, initial encounter for closed fracture ([place])  Displaced right intertrochanteric femur fracture - Orthopedic Surgery consulted with plans for operative intervention - NPO at midnight - Covid testing pre-op - Continue home ASA, Lexapro, Namenda - Hold Xanax, trazodone given concern for precipitation of post-operative delirium - Add Ca-Vit D; check Vit D levels - Heparin for DVT ppx - Tylenol scheduled for pain control; low-dose oxycodone PRN    Chronic medical conditions: - Dementia: continue home Namenda - Depression: continue  Lexapro, hold Xanax and trazodone as above  DVT prophylaxis: Heparin Code Status: Full Family Communication: Darryl Lent, daughter Disposition Plan: SNF in 2-3 days Consults called: Orthopedic Surgery (Dr. Doreatha Martin) Admission status: Inpatient   Jahden Schara Sharene Butters MD [facility]  If 7PM-7AM, please contact night-coverage [email or web address] Password [email or web address]  12/07/2018, 8:16 PM

## 2018-12-08 ENCOUNTER — Inpatient Hospital Stay (HOSPITAL_COMMUNITY): Payer: Medicare Other | Admitting: Critical Care Medicine

## 2018-12-08 ENCOUNTER — Inpatient Hospital Stay (HOSPITAL_COMMUNITY): Payer: Medicare Other

## 2018-12-08 ENCOUNTER — Encounter (HOSPITAL_COMMUNITY)
Admission: EM | Disposition: A | Discharge status: Skilled Nursing Facility | Payer: Self-pay | Source: Skilled Nursing Facility | Attending: Internal Medicine

## 2018-12-08 DIAGNOSIS — R1310 Dysphagia, unspecified: Secondary | ICD-10-CM

## 2018-12-08 DIAGNOSIS — S72141G Displaced intertrochanteric fracture of right femur, subsequent encounter for closed fracture with delayed healing: Secondary | ICD-10-CM

## 2018-12-08 DIAGNOSIS — F0390 Unspecified dementia without behavioral disturbance: Secondary | ICD-10-CM

## 2018-12-08 HISTORY — PX: INTRAMEDULLARY (IM) NAIL INTERTROCHANTERIC: SHX5875

## 2018-12-08 LAB — CBC
HCT: 28.8 % — ABNORMAL LOW (ref 36.0–46.0)
Hemoglobin: 9.2 g/dL — ABNORMAL LOW (ref 12.0–15.0)
MCH: 29 pg (ref 26.0–34.0)
MCHC: 31.9 g/dL (ref 30.0–36.0)
MCV: 90.9 fL (ref 80.0–100.0)
Platelets: 246 10*3/uL (ref 150–400)
RBC: 3.17 MIL/uL — ABNORMAL LOW (ref 3.87–5.11)
RDW: 12.2 % (ref 11.5–15.5)
WBC: 13.2 10*3/uL — ABNORMAL HIGH (ref 4.0–10.5)
nRBC: 0 % (ref 0.0–0.2)

## 2018-12-08 LAB — BASIC METABOLIC PANEL
Anion gap: 8 (ref 5–15)
BUN: 18 mg/dL (ref 8–23)
CO2: 25 mmol/L (ref 22–32)
Calcium: 8.4 mg/dL — ABNORMAL LOW (ref 8.9–10.3)
Chloride: 106 mmol/L (ref 98–111)
Creatinine, Ser: 0.85 mg/dL (ref 0.44–1.00)
GFR calc Af Amer: >60 mL/min (ref >60)
GFR calc non Af Amer: >60 mL/min (ref >60)
Glucose, Bld: 166 mg/dL — ABNORMAL HIGH (ref 70–99)
Potassium: 4.3 mmol/L (ref 3.5–5.1)
Sodium: 139 mmol/L (ref 135–145)

## 2018-12-08 LAB — SURGICAL PCR SCREEN
MRSA, PCR: NEGATIVE
Staphylococcus aureus: NEGATIVE

## 2018-12-08 LAB — HEPATIC FUNCTION PANEL
ALT: 16 U/L (ref 0–44)
AST: 22 U/L (ref 15–41)
Albumin: 3.5 g/dL (ref 3.5–5.0)
Alkaline Phosphatase: 85 U/L (ref 38–126)
Bilirubin, Direct: 0.2 mg/dL (ref 0.0–0.2)
Indirect Bilirubin: 0.4 mg/dL (ref 0.3–0.9)
Total Bilirubin: 0.6 mg/dL (ref 0.3–1.2)
Total Protein: 6.6 g/dL (ref 6.5–8.1)

## 2018-12-08 SURGERY — FIXATION, FRACTURE, INTERTROCHANTERIC, WITH INTRAMEDULLARY ROD
Anesthesia: General | Site: Hip | Laterality: Right

## 2018-12-08 MED ORDER — 0.9 % SODIUM CHLORIDE (POUR BTL) OPTIME
TOPICAL | Status: DC | PRN
  Administered 2018-12-08: 1000 mL

## 2018-12-08 MED ORDER — PROPOFOL 10 MG/ML IV BOLUS
INTRAVENOUS | Status: DC | PRN
  Administered 2018-12-08: 100 mg via INTRAVENOUS

## 2018-12-08 MED ORDER — PROPOFOL 10 MG/ML IV BOLUS
INTRAVENOUS | Status: AC
  Filled 2018-12-08: qty 40

## 2018-12-08 MED ORDER — ENOXAPARIN SODIUM 40 MG/0.4ML ~~LOC~~ SOLN
40.0000 mg | SUBCUTANEOUS | Status: DC
  Administered 2018-12-09 – 2018-12-11 (×3): 40 mg via SUBCUTANEOUS
  Filled 2018-12-08 (×3): qty 0.4

## 2018-12-08 MED ORDER — ONDANSETRON HCL 4 MG/2ML IJ SOLN: 4.0000 mg | Freq: Four times a day (QID) | INTRAMUSCULAR | Status: DC | PRN

## 2018-12-08 MED ORDER — ONDANSETRON HCL 4 MG/2ML IJ SOLN: 4.0000 mg | Freq: Once | INTRAMUSCULAR | Status: DC | PRN

## 2018-12-08 MED ORDER — SENNOSIDES-DOCUSATE SODIUM 8.6-50 MG PO TABS: 2.0000 | ORAL_TABLET | Freq: Every evening | ORAL | Status: DC | PRN

## 2018-12-08 MED ORDER — ROCURONIUM BROMIDE 10 MG/ML (PF) SYRINGE
PREFILLED_SYRINGE | INTRAVENOUS | Status: DC | PRN
  Administered 2018-12-08: 50 mg via INTRAVENOUS

## 2018-12-08 MED ORDER — ALBUMIN HUMAN 5 % IV SOLN
INTRAVENOUS | Status: DC | PRN
  Administered 2018-12-08: 10:00:00 via INTRAVENOUS

## 2018-12-08 MED ORDER — SODIUM CHLORIDE 0.9 % IV SOLN
INTRAVENOUS | Status: DC | PRN
  Administered 2018-12-08: 09:00:00 25 ug/min via INTRAVENOUS

## 2018-12-08 MED ORDER — ONDANSETRON HCL 4 MG PO TABS: 4.0000 mg | ORAL_TABLET | Freq: Four times a day (QID) | ORAL | Status: DC | PRN

## 2018-12-08 MED ORDER — METOCLOPRAMIDE HCL 5 MG/ML IJ SOLN: 5.0000 mg | Freq: Three times a day (TID) | INTRAMUSCULAR | Status: DC | PRN

## 2018-12-08 MED ORDER — PHENOL 1.4 % MT LIQD: 1.0000 | OROMUCOSAL | Status: DC | PRN

## 2018-12-08 MED ORDER — ASPIRIN EC 81 MG PO TBEC: 81.0000 mg | DELAYED_RELEASE_TABLET | Freq: Every day | ORAL | Status: DC

## 2018-12-08 MED ORDER — FENTANYL CITRATE (PF) 250 MCG/5ML IJ SOLN
INTRAMUSCULAR | Status: DC | PRN
  Administered 2018-12-08: 50 ug via INTRAVENOUS

## 2018-12-08 MED ORDER — MENTHOL 3 MG MT LOZG: 1.0000 | LOZENGE | OROMUCOSAL | Status: DC | PRN

## 2018-12-08 MED ORDER — ONDANSETRON HCL 4 MG/2ML IJ SOLN
INTRAMUSCULAR | Status: DC | PRN
  Administered 2018-12-08: 4 mg via INTRAVENOUS

## 2018-12-08 MED ORDER — LACTATED RINGERS IV SOLN
INTRAVENOUS | Status: DC | PRN
  Administered 2018-12-08: 08:00:00 via INTRAVENOUS

## 2018-12-08 MED ORDER — FENTANYL CITRATE (PF) 250 MCG/5ML IJ SOLN
INTRAMUSCULAR | Status: AC
  Filled 2018-12-08: qty 5

## 2018-12-08 MED ORDER — LIDOCAINE 2% (20 MG/ML) 5 ML SYRINGE
INTRAMUSCULAR | Status: DC | PRN
  Administered 2018-12-08: 40 mg via INTRAVENOUS

## 2018-12-08 MED ORDER — FENTANYL CITRATE (PF) 100 MCG/2ML IJ SOLN: 25.0000 ug | INTRAMUSCULAR | Status: DC | PRN

## 2018-12-08 MED ORDER — DOCUSATE SODIUM 100 MG PO CAPS
100.0000 mg | ORAL_CAPSULE | Freq: Two times a day (BID) | ORAL | Status: DC
  Administered 2018-12-08 – 2018-12-11 (×5): 100 mg via ORAL
  Filled 2018-12-08 (×6): qty 1

## 2018-12-08 MED ORDER — EPHEDRINE SULFATE-NACL 50-0.9 MG/10ML-% IV SOSY
PREFILLED_SYRINGE | INTRAVENOUS | Status: DC | PRN
  Administered 2018-12-08 (×2): 5 mg via INTRAVENOUS

## 2018-12-08 MED ORDER — SUGAMMADEX SODIUM 200 MG/2ML IV SOLN
INTRAVENOUS | Status: DC | PRN
  Administered 2018-12-08: 200 mg via INTRAVENOUS

## 2018-12-08 MED ORDER — POLYETHYLENE GLYCOL 3350 17 G PO PACK: 17.0000 g | PACK | Freq: Every day | ORAL | Status: DC | PRN

## 2018-12-08 MED ORDER — CEFAZOLIN SODIUM-DEXTROSE 2-4 GM/100ML-% IV SOLN
2.0000 g | Freq: Four times a day (QID) | INTRAVENOUS | Status: AC
  Administered 2018-12-08 (×2): 2 g via INTRAVENOUS
  Filled 2018-12-08 (×2): qty 100

## 2018-12-08 MED ORDER — METOCLOPRAMIDE HCL 5 MG PO TABS: 5.0000 mg | ORAL_TABLET | Freq: Three times a day (TID) | ORAL | Status: DC | PRN

## 2018-12-08 SURGICAL SUPPLY — 53 items
BIT DRILL 4.3MMS DISTAL GRDTED (BIT) IMPLANT
BIT DRILL LAG SCREW (DRILL) IMPLANT
BNDG COHESIVE 6X5 TAN STRL LF (GAUZE/BANDAGES/DRESSINGS) IMPLANT
BRUSH SCRUB EZ PLAIN DRY (MISCELLANEOUS) ×6 IMPLANT
COVER PERINEAL POST (MISCELLANEOUS) ×3 IMPLANT
COVER SURGICAL LIGHT HANDLE (MISCELLANEOUS) ×6 IMPLANT
COVER WAND RF STERILE (DRAPES) ×3 IMPLANT
DRAPE C-ARMOR (DRAPES) ×3 IMPLANT
DRAPE HALF SHEET 40X57 (DRAPES) IMPLANT
DRAPE ORTHO SPLIT 77X108 STRL (DRAPES)
DRAPE STERI IOBAN 125X83 (DRAPES) ×3 IMPLANT
DRAPE SURG ORHT 6 SPLT 77X108 (DRAPES) IMPLANT
DRAPE U-SHAPE 47X51 STRL (DRAPES) ×3 IMPLANT
DRILL 4.3MMS DISTAL GRADUATED (BIT) ×3
DRILL LAG SCREW (DRILL) ×3
DRSG EMULSION OIL 3X3 NADH (GAUZE/BANDAGES/DRESSINGS) ×3 IMPLANT
DRSG MEPILEX BORDER 4X4 (GAUZE/BANDAGES/DRESSINGS) ×7 IMPLANT
DRSG MEPILEX BORDER 4X8 (GAUZE/BANDAGES/DRESSINGS) ×3 IMPLANT
ELECT REM PT RETURN 9FT ADLT (ELECTROSURGICAL) ×3
ELECTRODE REM PT RTRN 9FT ADLT (ELECTROSURGICAL) ×1 IMPLANT
GLOVE BIO SURGEON STRL SZ7.5 (GLOVE) ×3 IMPLANT
GLOVE BIO SURGEON STRL SZ8 (GLOVE) ×3 IMPLANT
GLOVE BIOGEL PI IND STRL 7.5 (GLOVE) ×1 IMPLANT
GLOVE BIOGEL PI IND STRL 8 (GLOVE) ×1 IMPLANT
GLOVE BIOGEL PI INDICATOR 7.5 (GLOVE) ×2
GLOVE BIOGEL PI INDICATOR 8 (GLOVE) ×2
GOWN STRL REUS W/ TWL LRG LVL3 (GOWN DISPOSABLE) ×2 IMPLANT
GOWN STRL REUS W/ TWL XL LVL3 (GOWN DISPOSABLE) ×1 IMPLANT
GOWN STRL REUS W/TWL LRG LVL3 (GOWN DISPOSABLE) ×6
GOWN STRL REUS W/TWL XL LVL3 (GOWN DISPOSABLE) ×3
GUIDEPIN 3.2X17.5 THRD DISP (PIN) ×2 IMPLANT
GUIDEWIRE BALL NOSE 100CM (WIRE) ×2 IMPLANT
KIT BASIN OR (CUSTOM PROCEDURE TRAY) ×3 IMPLANT
KIT TURNOVER KIT B (KITS) ×3 IMPLANT
MANIFOLD NEPTUNE II (INSTRUMENTS) ×3 IMPLANT
NAIL HIP FRACTURE 11X380MM (Nail) ×2 IMPLANT
NS IRRIG 1000ML POUR BTL (IV SOLUTION) ×3 IMPLANT
PACK GENERAL/GYN (CUSTOM PROCEDURE TRAY) ×3 IMPLANT
PAD ARMBOARD 7.5X6 YLW CONV (MISCELLANEOUS) ×6 IMPLANT
SCREW BONE CORTICAL 5.0X42 (Screw) ×2 IMPLANT
SCREW LAG HIP NAIL 10.5X95 (Screw) ×2 IMPLANT
STAPLER VISISTAT 35W (STAPLE) ×3 IMPLANT
STOCKINETTE IMPERVIOUS LG (DRAPES) IMPLANT
SUT ETHILON 3 0 PS 1 (SUTURE) ×3 IMPLANT
SUT VIC AB 0 CT1 27 (SUTURE) ×3
SUT VIC AB 0 CT1 27XBRD ANBCTR (SUTURE) ×1 IMPLANT
SUT VIC AB 1 CT1 27 (SUTURE) ×3
SUT VIC AB 1 CT1 27XBRD ANBCTR (SUTURE) ×1 IMPLANT
SUT VIC AB 2-0 CT1 27 (SUTURE) ×3
SUT VIC AB 2-0 CT1 TAPERPNT 27 (SUTURE) ×1 IMPLANT
TOWEL GREEN STERILE (TOWEL DISPOSABLE) ×8 IMPLANT
TOWEL GREEN STERILE FF (TOWEL DISPOSABLE) ×3 IMPLANT
WATER STERILE IRR 1000ML POUR (IV SOLUTION) ×3 IMPLANT

## 2018-12-08 NOTE — Transfer of Care (Signed)
Immediate Anesthesia Transfer of Care Note  Patient: Tara Henderson  Procedure(s) Performed: INTRAMEDULLARY (IM) NAIL INTERTROCHANTRIC (Right Hip)  Patient Location: PACU  Anesthesia Type:General  Level of Consciousness: drowsy and patient cooperative  Airway & Oxygen Therapy: Patient Spontanous Breathing and Patient connected to nasal cannula oxygen  Post-op Assessment: Report given to RN and Post -op Vital signs reviewed and stable  Post vital signs: Reviewed and stable  Last Vitals:  Vitals Value Taken Time  BP 140/79 12/08/18 1001  Temp    Pulse 89 12/08/18 1006  Resp 21 12/08/18 1006  SpO2 100 % 12/08/18 1006  Vitals shown include unvalidated device data.  Last Pain:  Vitals:   12/08/18 0511  TempSrc: Axillary         Complications: No apparent anesthesia complications

## 2018-12-08 NOTE — Consult Note (Signed)
Orthopaedic Trauma Service (OTS) Consult   Patient ID: Tara Henderson MRN: [id number] DOB/AGE: 04/19/1945 73 y.o.   Reason for Consult: displaced right intertroch fracture Referring Physician: Dr. Koren Bound  HPI: Tara Henderson is an 73 y.o. female fell at her nursing facility resulting in pain, deformity, and inability to bear weight on the RLEX. Not communicating with staff this am, which is baseline per daughter.  Past Medical History:  Diagnosis Date   Dementia (HCC)    Hypercalcemia    Hypertension    Osteopetrosis    Renal disorder     History reviewed. No pertinent surgical history.  Family History  Problem Relation Age of Onset   Heart attack Mother    Other Father        murdered    Social History:  reports that she has never smoked. She has never used smokeless tobacco. She reports that she does not drink alcohol or use drugs.  Allergies:  Allergies  Allergen Reactions   Keflex [Cephalexin] Other (See Comments)    Per SNF - unknown reaction   Penicillins Other (See Comments)    Per SNF - unknown reaction Did it involve swelling of the face/tongue/throat, SOB, or low BP? Unknown Did it involve sudden or severe rash/hives, skin peeling, or any reaction on the inside of your mouth or nose? Unknown Did you need to seek medical attention at a hospital or doctor's office? Unknown When did it last happen?unknown If all above answers are NO, may proceed with cephalosporin use.    Medications:  Prior to Admission:  Medications Prior to Admission  Medication Sig Dispense Refill Last Dose   acetaminophen (TYLENOL) 500 MG tablet Take 1,000 mg by mouth 2 (two) times daily.    12/07/2018 at am   aspirin 81 MG chewable tablet Chew 81 mg by mouth daily.   12/07/2018 at 800   docusate sodium (COLACE) 100 MG capsule Take 100 mg by mouth daily as needed (constipation).   unknown   escitalopram (LEXAPRO) 10 MG tablet Take 10 mg by mouth  daily.   12/07/2018 at 800   Memantine HCl ER (NAMENDA XR) 21 MG CP24 Take 21 mg by mouth daily.    12/07/2018 at 800   traZODone (DESYREL) 50 MG tablet Take 50 mg by mouth at bedtime.   12/06/2018 at 2000    Results for orders placed or performed during the hospital encounter of 12/07/18 (from the past 48 hour(s))  Basic metabolic panel     Status: Abnormal   Collection Time: 12/07/18  6:39 PM  Result Value Ref Range   Sodium 139 135 - 145 mmol/L   Potassium 4.7 3.5 - 5.1 mmol/L   Chloride 103 98 - 111 mmol/L   CO2 26 22 - 32 mmol/L   Glucose, Bld 166 (H) 70 - 99 mg/dL   BUN 27 (H) 8 - 23 mg/dL   Creatinine, Ser 6.29 0.44 - 1.00 mg/dL   Calcium 8.8 (L) 8.9 - 10.3 mg/dL   GFR calc non Af Amer 55 (L) >60 mL/min   GFR calc Af Amer >60 >60 mL/min   Anion gap 10 5 - 15    Comment: Performed at [facility], [address], [place], [place] [address]  CBC with Differential     Status: Abnormal   Collection Time: 12/07/18  6:39 PM  Result Value Ref Range   WBC 13.6 (H) 4.0 - 10.5 K/uL  RBC 4.20 3.87 - 5.11 MIL/uL   Hemoglobin 12.4 12.0 - 15.0 g/dL   HCT 16.139.6 09.636.0 - 04.546.0 %   MCV 94.3 80.0 - 100.0 fL   MCH 29.5 26.0 - 34.0 pg   MCHC 31.3 30.0 - 36.0 g/dL   RDW 40.912.4 81.111.5 - 91.415.5 %   Platelets 276 150 - 400 K/uL   nRBC 0.0 0.0 - 0.2 %   Neutrophils Relative % 86 %   Neutro Abs 11.8 (H) 1.7 - 7.7 K/uL   Lymphocytes Relative 7 %   Lymphs Abs 0.9 0.7 - 4.0 K/uL   Monocytes Relative 5 %   Monocytes Absolute 0.7 0.1 - 1.0 K/uL   Eosinophils Relative 1 %   Eosinophils Absolute 0.2 0.0 - 0.5 K/uL   Basophils Relative 0 %   Basophils Absolute 0.0 0.0 - 0.1 K/uL   Immature Granulocytes 1 %   Abs Immature Granulocytes 0.07 0.00 - 0.07 K/uL    Comment: Performed at [facility], [address] [place], [place] [address]  Protime-INR     Status: None   Collection Time: 12/07/18  6:39 PM  Result Value Ref Range   Prothrombin Time 13.7 11.4 - 15.2 seconds   INR 1.1 0.8 - 1.2     Comment: (NOTE) INR goal varies based on device and disease states. Performed at [facility], [address] [place], [place] [address]   Type and screen [facility]     Status: None   Collection Time: 12/07/18  6:40 PM  Result Value Ref Range   ABO/RH(D) O POS    Antibody Screen NEG    Sample Expiration      12/10/2018,2359 Performed at [facility], [address] [place], [place] [address]   ABO/Rh     Status: None   Collection Time: 12/07/18  6:40 PM  Result Value Ref Range   ABO/RH(D)      O POS Performed at [facility], [address] [place], [place] [address]   SARS Coronavirus 2 [facility](Hospital order, Performed in [facility] hospital lab) Nasopharyngeal Nasopharyngeal Swab     Status: None   Collection Time: 12/07/18  7:20 PM   Specimen: Nasopharyngeal Swab  Result Value Ref Range   SARS Coronavirus 2 NEGATIVE NEGATIVE    Comment: (NOTE) If result is NEGATIVE SARS-CoV-2 target nucleic acids are NOT DETECTED. The SARS-CoV-2 RNA is generally detectable in upper and lower  respiratory specimens during the acute phase of infection. The lowest  concentration of SARS-CoV-2 viral copies this assay can detect is 250  copies / mL. A negative result does not preclude SARS-CoV-2 infection  and should not be used as the sole basis for treatment or other  patient management decisions.  A negative result may occur with  improper specimen collection / handling, submission of specimen other  than nasopharyngeal swab, presence of viral mutation(s) within the  areas targeted by this assay, and inadequate number of viral copies  (<250 copies / mL). A negative result must be combined with clinical  observations, patient history, and epidemiological information. If result is POSITIVE SARS-CoV-2 target nucleic acids are DETECTED. The SARS-CoV-2 RNA is generally detectable in upper and lower  respiratory specimens dur ing the acute phase of infection.   Positive  results are indicative of active infection with SARS-CoV-2.  Clinical  correlation with patient history and other diagnostic information is  necessary to determine patient infection status.  Positive results do  not rule out bacterial infection or co-infection with other viruses. If result is PRESUMPTIVE POSTIVE SARS-CoV-2 nucleic acids MAY BE PRESENT.   A presumptive positive result was obtained on the submitted specimen  and confirmed on repeat testing.  While 2019 novel coronavirus  (SARS-CoV-2) nucleic acids may be present in the submitted sample  additional confirmatory testing may be necessary for epidemiological  and / or clinical management purposes  to differentiate between  SARS-CoV-2 and other Sarbecovirus currently known to infect humans.  If clinically indicated additional testing with an alternate test  methodology [id number]) is advised. The SARS-CoV-2 RNA is generally  detectable in upper and lower respiratory sp ecimens during the acute  phase of infection. The expected result is Negative. Fact Sheet for Patients:  BoilerBrush.com.cy Fact Sheet for Healthcare Providers: https://pope.com/ This test is not yet approved or cleared by the [place] FDA and has been authorized for detection and/or diagnosis of SARS-CoV-2 by FDA under an Emergency Use Authorization (EUA).  This EUA will remain in effect (meaning this test can be used) for the duration of the COVID-19 declaration under Section 564(b)(1) of the Act, 21 U.S.C. section 360bbb-3(b)(1), unless the authorization is terminated or revoked sooner. Performed at [facility], [address], [place], [place] [address]   Hepatic function panel     Status: None   Collection Time: 12/07/18 11:00 PM  Result Value Ref Range   Total Protein 6.6 6.5 - 8.1 g/dL   Albumin 3.5 3.5 - 5.0 g/dL   AST 22 15 - 41 U/L   ALT 16 0 - 44 U/L   Alkaline Phosphatase 85  38 - 126 U/L   Total Bilirubin 0.6 0.3 - 1.2 mg/dL   Bilirubin, Direct 0.2 0.0 - 0.2 mg/dL   Indirect Bilirubin 0.4 0.3 - 0.9 mg/dL    Comment: Performed at [facility], [address] [place], [place] [address]  Surgical PCR screen     Status: None   Collection Time: 12/08/18  1:44 AM   Specimen: Nasal Mucosa; Nasal Swab  Result Value Ref Range   MRSA, PCR NEGATIVE NEGATIVE   Staphylococcus aureus NEGATIVE NEGATIVE    Comment: (NOTE) The Xpert SA Assay (FDA approved for NASAL specimens in patients 37 years of age and older), is one component of a comprehensive surveillance program. It is not intended to diagnose infection nor to guide or monitor treatment. Performed at [facility], [address], [place], [place] [address]     Dg Chest 1 View  Result Date: 12/07/2018 CLINICAL DATA:  Preop.  Pain status post fall EXAM: CHEST  1 VIEW COMPARISON:  02/16/2016 FINDINGS: There are chronic lung changes at the lung bases bilaterally favored to represent atelectasis or scarring. There is likely a background of emphysematous changes. There is no pneumothorax. No large pleural effusion. The heart size is normal. Aortic calcifications are noted. IMPRESSION: 1. No acute cardiopulmonary process identified. 2. Probable underlying emphysema. Electronically Signed   By: Katherine Mantle M.D.   On: 12/07/2018 18:39   Dg Knee 2 Views Right  Result Date: 12/07/2018 CLINICAL DATA:  Right leg pain EXAM: RIGHT KNEE - 1-2 VIEW COMPARISON:  None. FINDINGS: There is no acute displaced fracture. No dislocation. Mild degenerative changes are noted. There is no significant joint effusion. IMPRESSION: No acute osseous abnormality detected. Electronically Signed   By: Katherine Mantle M.D.   On: 12/07/2018 18:35   Ct Head Wo Contrast  Result Date: 12/07/2018 CLINICAL DATA:  Acute pain due to trauma EXAM: CT HEAD WITHOUT CONTRAST CT MAXILLOFACIAL WITHOUT CONTRAST CT CERVICAL SPINE WITHOUT  CONTRAST TECHNIQUE: Multidetector CT imaging of the head, cervical spine, and maxillofacial structures were performed using the standard protocol without intravenous contrast. Multiplanar CT image reconstructions of the cervical spine and maxillofacial structures were also generated. COMPARISON:  CT head dated June 11, 2018 FINDINGS: CT HEAD FINDINGS Brain: No evidence of acute infarction, hemorrhage, hydrocephalus, extra-axial collection or mass lesion/mass effect. Again noted is global atrophy and chronic microvascular ischemic changes. Vascular: No hyperdense vessel or unexpected calcification. Skull: Normal. Negative for fracture or focal lesion. There is frontal scalp swelling without evidence for an underlying fracture. Other: None. CT MAXILLOFACIAL FINDINGS Osseous: No fracture or mandibular dislocation. No destructive process. Orbits: Negative. No traumatic or inflammatory finding. Sinuses: Clear. Soft tissues: There is right frontal and right periorbital soft tissue swelling without evidence for an underlying fracture. CT CERVICAL SPINE FINDINGS Alignment: Normal. Skull base and vertebrae: No acute fracture. No primary bone lesion or focal pathologic process. Soft tissues and spinal canal: No prevertebral fluid or swelling. No visible canal hematoma. Disc levels: There is multilevel disc height loss throughout the cervical spine, greatest at the lower cervical levels. Upper chest: There may be a fracture of the posterior fourth rib on the right. This is only partially visualized. Other: None IMPRESSION: 1. No acute intracranial abnormality. 2. No facial fracture. 3. No acute cervical spine fracture. 4. Right frontal and periorbital soft tissue swelling without evidence for a fracture. 5. Possible nondisplaced fracture involving the posterior fourth rib on the right. This is only partially visualized. Electronically Signed   By: Katherine Mantlehristopher  Green M.D.   On: 12/07/2018 19:37   Ct Cervical Spine Wo  Contrast  Result Date: 12/07/2018 CLINICAL DATA:  Acute pain due to trauma EXAM: CT HEAD WITHOUT CONTRAST CT MAXILLOFACIAL WITHOUT CONTRAST CT CERVICAL SPINE WITHOUT CONTRAST TECHNIQUE: Multidetector CT imaging of the head, cervical spine, and maxillofacial structures were performed using the standard protocol without intravenous contrast. Multiplanar CT image reconstructions of the cervical spine and maxillofacial structures were also generated. COMPARISON:  CT head dated June 11, 2018 FINDINGS: CT HEAD FINDINGS Brain: No evidence of acute infarction, hemorrhage, hydrocephalus, extra-axial collection or mass lesion/mass effect. Again noted is global atrophy and chronic microvascular ischemic changes. Vascular: No hyperdense vessel or unexpected calcification. Skull: Normal. Negative for fracture or focal lesion. There is frontal scalp swelling without evidence for an underlying fracture. Other: None. CT MAXILLOFACIAL FINDINGS Osseous: No fracture or mandibular dislocation. No destructive process. Orbits: Negative. No traumatic or inflammatory finding. Sinuses: Clear. Soft tissues: There is right frontal and right periorbital soft tissue swelling without evidence for an underlying fracture. CT CERVICAL SPINE FINDINGS Alignment: Normal. Skull base and vertebrae: No acute fracture. No primary bone lesion or focal pathologic process. Soft tissues and spinal canal: No prevertebral fluid or swelling. No visible canal hematoma. Disc levels: There is multilevel disc height loss throughout the cervical spine, greatest at the lower cervical levels. Upper chest: There may be a fracture of the posterior fourth rib on the right. This is only partially visualized. Other: None IMPRESSION: 1. No acute intracranial abnormality. 2. No facial fracture. 3. No acute cervical spine fracture. 4. Right frontal and periorbital soft tissue swelling without evidence for a fracture. 5. Possible nondisplaced fracture involving the posterior  fourth rib on the right. This is only partially visualized. Electronically Signed   By: Cristal Deerhristopher  Green M.D.   On: 12/07/2018 19:37   Dg Hip Unilat W Or Wo Pelvis 2-3 Views Right  Result Date: 12/07/2018 CLINICAL DATA:  Pain EXAM: DG HIP (WITH OR WITHOUT PELVIS) 2-3V RIGHT COMPARISON:  None. FINDINGS: There is an acute displaced comminuted intratrochanteric fracture of the proximal right femur. There is no dislocation. There are degenerative changes of both hips. IMPRESSION: Acute intratrochanteric fracture of the right femur. Electronically Signed   By: Katherine Mantle M.D.   On: 12/07/2018 18:34   Ct Maxillofacial Wo Contrast  Result Date: 12/07/2018 CLINICAL DATA:  Acute pain due to trauma EXAM: CT HEAD WITHOUT CONTRAST CT MAXILLOFACIAL WITHOUT CONTRAST CT CERVICAL SPINE WITHOUT CONTRAST TECHNIQUE: Multidetector CT imaging of the head, cervical spine, and maxillofacial structures were performed using the standard protocol without intravenous contrast. Multiplanar CT image reconstructions of the cervical spine and maxillofacial structures were also generated. COMPARISON:  CT head dated June 11, 2018 FINDINGS: CT HEAD FINDINGS Brain: No evidence of acute infarction, hemorrhage, hydrocephalus, extra-axial collection or mass lesion/mass effect. Again noted is global atrophy and chronic microvascular ischemic changes. Vascular: No hyperdense vessel or unexpected calcification. Skull: Normal. Negative for fracture or focal lesion. There is frontal scalp swelling without evidence for an underlying fracture. Other: None. CT MAXILLOFACIAL FINDINGS Osseous: No fracture or mandibular dislocation. No destructive process. Orbits: Negative. No traumatic or inflammatory finding. Sinuses: Clear. Soft tissues: There is right frontal and right periorbital soft tissue swelling without evidence for an underlying fracture. CT CERVICAL SPINE FINDINGS Alignment: Normal. Skull base and vertebrae: No acute fracture. No  primary bone lesion or focal pathologic process. Soft tissues and spinal canal: No prevertebral fluid or swelling. No visible canal hematoma. Disc levels: There is multilevel disc height loss throughout the cervical spine, greatest at the lower cervical levels. Upper chest: There may be a fracture of the posterior fourth rib on the right. This is only partially visualized. Other: None IMPRESSION: 1. No acute intracranial abnormality. 2. No facial fracture. 3. No acute cervical spine fracture. 4. Right frontal and periorbital soft tissue swelling without evidence for a fracture. 5. Possible nondisplaced fracture involving the posterior fourth rib on the right. This is only partially visualized. Electronically Signed   By: Katherine Mantle M.D.   On: 12/07/2018 19:37    ROS unable to obtain Blood pressure (!) 146/78, pulse 82, temperature 98 F (36.7 C), temperature source Axillary, resp. rate 17, SpO2 97 %. Physical Exam Not communicating  RLE Right hip is tender  Edema/ swelling controlled  Sens: DPN, SPN, TN intact  Motor: EHL, FHL, and lessor toe ext and flex all intact grossly  Brisk cap refill, warm to touch Gait: unable to assess  Assessment/Plan:  Displaced right intertroch fracture Dementia  The risks and benefits of surgery were discussed with the daughter, including the possibility of infection, nerve injury, vessel injury, wound breakdown, arthritis, symptomatic hardware, DVT/ PE, loss of motion, malunion, nonunion, and need for further surgery among others.  We also specifically discussed the need to stage surgery because of the elevated risk of soft tissue breakdown that could lead to amputation.  He acknowledged these risks and wished to proceed.   Weightbearing: WBAT RLE Insicional and dressing care: OK to remove dressings if soiled and leave open to air with dry gauze PRN Orthopedic device(s): None Showering: YEs VTE prophylaxis: Aspirin 81mg  qd as per pre-op Pain  control: Tylenol Follow - up plan: 2 weeks Contact information:  Myrene Galas MD, Montez Morita PA  Altamese [place], MD [facility], [facility] [phone]  12/08/2018, 8:10 AM  [facility] [place] [address] [phone] (F)

## 2018-12-08 NOTE — Op Note (Signed)
12/08/2018  12:13 PM  PATIENT:  Tara Henderson  73 y.o. female  PRE-OPERATIVE DIAGNOSIS:  RIGHT INTERTROCHANTERIC HIP FRACTURE  POST-OPERATIVE DIAGNOSIS:  RIGHT INTERTROCHANTERIC HIP FRACTURE  PROCEDURE:  Procedure(s): INTRAMEDULLARY (IM) NAIL INTERTROCHANTRIC (Right) WITH BIOMET AFFIXUS 11 MM X 380 MM STATICALLY LOCKED NAIL  SURGEON:  Surgeon(s) and Role:    Altamese [facility], MD - Primary  PHYSICIAN ASSISTANT: Ainsley Spinner, PA-C  ANESTHESIA:   general  I/O:  Total I/O In: 750 [I.V.:500; IV Piggyback:250] Out: 150 [Blood:150]  SPECIMEN:  No Specimen  TOURNIQUET:  * No tourniquets in log *  DICTATION: .Note written in EPIC  COMPLICATIONS:  None.  DISPOSITION:  To PACU.  CONDITION:  Stable.  BRIEF SUMMARY AND INDICATION OF PROCEDURE:   Tara Henderson is a 73 y.o.year-old with dementia.  The risks and benefits of surgical treatment  including the potential for malunion, nonunion, symptomatic hardware, heart attack, stroke, neurovascular injury, bleeding, and others were discussed with patient's daughter.  After full discussion, the family provided consent to proceed.  BRIEF SUMMARY OF PROCEDURE:  The patient was taken to the operating room where general anesthesia was induced. He was positioned supine on the Hana fracture table.  A closed reduction maneuver was performed of the fractured proximal femur and this was confirmed on both AP and lateral xray views. A thorough scrub and wash with chlorhexidine and then Betadine scrub and paint was performed.  After sterile drapes and time-out, a long instrument was used to identify the appropriate starting position under C-arm on both AP and lateral images.  A 3 cm incision was made proximal to the greater trochanter.  The curved cannulated awl was inserted just medial to the tip of the lateral trochanter and then the starting guidewire advanced into the proximal femur.  This was checked on AP and lateral views.  The  starting reamer was engaged with the soft tissue protected by a sleeve.  The curved ball-tipped guidewire was then inserted, making sure it was just posterior as possible in the distal femur and across the fracture site, which stayed in a reduced position.  It was sequentially reamed up to 13 and an 11 x 400 mm nail inserted to the appropriate depth.  The guidewire for the lag screw was then inserted with the appropriate anteversion to make sure it approximated a center-center position.  This was measured and the lag screw placed with excellent purchase and position checked on both views.  The set screw was then engaged within the groove of the lag screw, which was allowed to telescope.  Traction was released and compression achieved with the screw.  This was followed by placement of one distal locking screw using perfect circle technique.  This was confirmed on AP and lateral images. Wounds were irrigated thoroughly, closed in a standard layered fashion. Sterile gently compressive dressings were applied.  The patient was awakened from anesthesia and transported to the PACU in stable condition.  PROGNOSIS:  The patient will be weightbearing as tolerated with physical Therapy resuming a baby ASA for DVT prophylaxis as soon as deemed stable by the Primary Care Service.  She has no range of motion precautions.  We will continue to follow through at the hospital.  Anticipate follow up in the office in 2 weeks for removal of sutures and further evaluation.     Tara Henderson. Marcelino Scot, M.D.

## 2018-12-08 NOTE — Progress Notes (Signed)
Tried to call pt's daughter about the pt's consent for surgery. Will call again.

## 2018-12-08 NOTE — Anesthesia Preprocedure Evaluation (Signed)
Anesthesia Evaluation  Patient identified by MRN, date of birth, ID band Patient awake    Reviewed: Allergy & Precautions, NPO status , Patient's Chart, lab work & pertinent test results  Airway Mallampati: II  TM Distance: >3 FB   Mouth opening: Limited Mouth Opening  Dental  (+) Poor Dentition   Pulmonary    breath sounds clear to auscultation       Cardiovascular hypertension,  Rhythm:Regular Rate:Normal     Neuro/Psych    GI/Hepatic   Endo/Other    Renal/GU      Musculoskeletal   Abdominal   Peds  Hematology   Anesthesia Other Findings   Reproductive/Obstetrics                             Anesthesia Physical Anesthesia Plan  ASA: III  Anesthesia Plan: General   Post-op Pain Management:    Induction: Intravenous  PONV Risk Score and Plan: Ondansetron and Dexamethasone  Airway Management Planned: Oral ETT  Additional Equipment:   Intra-op Plan:   Post-operative Plan: Extubation in OR  Informed Consent: I have reviewed the patients History and Physical, chart, labs and discussed the procedure including the risks, benefits and alternatives for the proposed anesthesia with the patient or authorized representative who has indicated his/her understanding and acceptance.       Plan Discussed with: CRNA and Anesthesiologist  Anesthesia Plan Comments:         Anesthesia Quick Evaluation

## 2018-12-08 NOTE — Progress Notes (Signed)
Tried to call daughter again to verify the consent. Left voice message.

## 2018-12-08 NOTE — Progress Notes (Deleted)
Called pt's daughter about signing consent for the pt. Message left. Will call again.

## 2018-12-08 NOTE — Progress Notes (Signed)
SLP Cancellation Note  Patient Details Name: Tara Henderson MRN: [id number] DOB: 11-03-1945   Cancelled treatment:        Attempted to see pt for swallowing evaluation.  Pt has just returned from surgery and was not medically appropriate for PO trials at time of attempt.  SLP will reattempt as schedule permits when pt is alert and able to participate.   Celedonio Savage, MA, [place] Office: [phone]; Pager [phone]): [phone] 12/08/2018, 11:40 AM

## 2018-12-08 NOTE — Plan of Care (Signed)
  Problem: Health Behavior/Discharge Planning: Goal: Ability to manage health-related needs will improve Outcome: Progressing   

## 2018-12-08 NOTE — Progress Notes (Addendum)
Received pt alert oriented to person only. Pt had been lying on her side all the time. Pt with oxygen on at 2L per nasal canula. Pt speaks softly. Repositioned pt. Ice pack to right hip applied. 

## 2018-12-08 NOTE — Social Work (Signed)
CSW acknowledging consult for SNF placement. Pt from [facility] which is an ALF.  Will follow for therapy recommendations needed to best determine disposition/for insurance authorization.   Westley Hummer, MSW, [place] Work [phone]

## 2018-12-08 NOTE — Anesthesia Postprocedure Evaluation (Signed)
Anesthesia Post Note  Patient: Tara Henderson  Procedure(s) Performed: INTRAMEDULLARY (IM) NAIL INTERTROCHANTRIC (Right Hip)     Patient location during evaluation: PACU Anesthesia Type: General Level of consciousness: awake, confused and responds to stimulation Pain management: pain level controlled Vital Signs Assessment: post-procedure vital signs reviewed and stable Respiratory status: spontaneous breathing, nonlabored ventilation, respiratory function stable and patient connected to nasal cannula oxygen Cardiovascular status: blood pressure returned to baseline and stable Postop Assessment: no apparent nausea or vomiting Anesthetic complications: no    Last Vitals:  Vitals:   12/08/18 1123 12/08/18 1535  BP: (!) 129/96 (!) 150/99  Pulse: 90 (!) 102  Resp: 16 20  Temp: 36.8 C 36.8 C  SpO2:  100%    Last Pain:  Vitals:   12/08/18 1535  TempSrc: Oral                 Andreia Gandolfi COKER

## 2018-12-08 NOTE — Anesthesia Procedure Notes (Signed)
Procedure Name: Intubation Date/Time: 12/08/2018 8:29 AM Performed by: Valda Favia, CRNA Pre-anesthesia Checklist: Patient identified, Emergency Drugs available, Suction available, Patient being monitored and Timeout performed Patient Re-evaluated:Patient Re-evaluated prior to induction Oxygen Delivery Method: Circle system utilized Preoxygenation: Pre-oxygenation with 100% oxygen Induction Type: IV induction Ventilation: Mask ventilation without difficulty Laryngoscope Size: Mac and 4 Grade View: Grade I Tube type: Oral Tube size: 7.0 mm Number of attempts: 1 Airway Equipment and Method: Stylet Placement Confirmation: ETT inserted through vocal cords under direct vision,  positive ETCO2 and breath sounds checked- equal and bilateral Secured at: 20 cm Tube secured with: Tape Dental Injury: Teeth and Oropharynx as per pre-operative assessment

## 2018-12-08 NOTE — Progress Notes (Signed)
PROGRESS NOTE    Tara Henderson  JJO:[id number] DOB: Aug 26, 1945 DOA: 12/07/2018 PCP: Sande Brothers, MD   Brief Narrative:  73 year old female with progressive dementia, essential hypertension resides at a memory care unit came to the hospital after sustaining a fall.  Patient has been having falls with her facility recently but this time started experiencing right-sided hip pain.  She was found to have acute intertrochanteric left femoral neck fracture.  There was also posterior rib fracture on the left side and right-sided facial bone swelling.   Assessment & Plan:   Active Problems:   Displaced intertrochanteric fracture of right femur (HCC)   Mechanical fall causing displaced right intertrochanteric femoral neck fracture Undisplaced posterior fourth rib fracture on the right -Trauma work-up performed in the ER. -Status post intramedullary nailing 8/21 -Pain control, PT/OT, bowel regimen.  Advanced dementia -On Namenda and Lexapro.  Seen by speech and swallow but patient is drowsy at this time for further evaluation.  Goals of care discussion-patient is DNR.  DVT prophylaxis: Aspirin Code Status: DNR; per daughter  Family Communication: Daughter at bedside Disposition Plan: Maintain hospital stay for postop management, PT/OT pending disposition.   Consultants:   Ortho  Procedures:   Intramedullary nailing of the right hip 8/31  Antimicrobials:   None   Subjective: Patient seen after his surgery, he is drowsy.  Daughter is at the bedside.  Daughter tells me that patient is ambulatory at baseline but for the past week or so she has fallen multiple times.  Usually she is not able to recognize most of the people as her dementia has progressed quite a bit.  Review of Systems Otherwise negative except as per HPI, including: Difficult to assess given her mentation  Objective: Vitals:   12/08/18 1031 12/08/18 1045 12/08/18 1100 12/08/18 1123  BP: 134/74 (!) 141/80  128/81 (!) 129/96  Pulse: 93 89 90 90  Resp: 16 20 16 16   Temp:   97.7 F (36.5 C) 98.3 F (36.8 C)  TempSrc:    Oral  SpO2: 92% 100% 100%     Intake/Output Summary (Last 24 hours) at 12/08/2018 1335 Last data filed at 12/08/2018 1004 Gross per 24 hour  Intake 750 ml  Output 150 ml  Net 600 ml   There were no vitals filed for this visit.  Examination:  General exam: Easily arousable but very drowsy Respiratory system: Clear to auscultation. Respiratory effort normal. Cardiovascular system: S1 & S2 heard, RRR. No JVD, murmurs, rubs, gallops or clicks. No pedal edema. Gastrointestinal system: Abdomen is nondistended, soft and nontender. No organomegaly or masses felt. Normal bowel sounds heard. Central nervous system: Alert to her name, moves all extremities Extremities: Symmetric 4 x 5 power. Skin: Surgical dressing without any evidence of bleeding.  Ecchymosis of face noted. Psychiatry: Poor judgment    Data Reviewed:   CBC: Recent Labs  Lab 12/07/18 1839 12/08/18 1159  WBC 13.6* 13.2*  NEUTROABS 11.8*  --   HGB 12.4 9.2*  HCT 39.6 28.8*  MCV 94.3 90.9  PLT 276 160   Basic Metabolic Panel: Recent Labs  Lab 12/07/18 1839 12/08/18 1159  NA 139 139  K 4.7 4.3  CL 103 106  CO2 26 25  GLUCOSE 166* 166*  BUN 27* 18  CREATININE 1.00 0.85  CALCIUM 8.8* 8.4*   GFR: CrCl cannot be calculated (Unknown ideal weight.). Liver Function Tests: Recent Labs  Lab 12/07/18 2300  AST 22  ALT 16  ALKPHOS 85  BILITOT 0.6  PROT 6.6  ALBUMIN 3.5   No results for input(s): LIPASE, AMYLASE in the last 168 hours. No results for input(s): AMMONIA in the last 168 hours. Coagulation Profile: Recent Labs  Lab 12/07/18 1839  INR 1.1   Cardiac Enzymes: No results for input(s): CKTOTAL, CKMB, CKMBINDEX, TROPONINI in the last 168 hours. BNP (last 3 results) No results for input(s): PROBNP in the last 8760 hours. HbA1C: No results for input(s): HGBA1C in the last 72  hours. CBG: No results for input(s): GLUCAP in the last 168 hours. Lipid Profile: No results for input(s): CHOL, HDL, LDLCALC, TRIG, CHOLHDL, LDLDIRECT in the last 72 hours. Thyroid Function Tests: No results for input(s): TSH, T4TOTAL, FREET4, T3FREE, THYROIDAB in the last 72 hours. Anemia Panel: No results for input(s): VITAMINB12, FOLATE, FERRITIN, TIBC, IRON, RETICCTPCT in the last 72 hours. Sepsis Labs: No results for input(s): PROCALCITON, LATICACIDVEN in the last 168 hours.  Recent Results (from the past 240 hour(s))  SARS Coronavirus 2 [facility](Hospital order, Performed in [facility] hospital lab) Nasopharyngeal Nasopharyngeal Swab     Status: None   Collection Time: 12/07/18  7:20 PM   Specimen: Nasopharyngeal Swab  Result Value Ref Range Status   SARS Coronavirus 2 NEGATIVE NEGATIVE Final    Comment: (NOTE) If result is NEGATIVE SARS-CoV-2 target nucleic acids are NOT DETECTED. The SARS-CoV-2 RNA is generally detectable in upper and lower  respiratory specimens during the acute phase of infection. The lowest  concentration of SARS-CoV-2 viral copies this assay can detect is 250  copies / mL. A negative result does not preclude SARS-CoV-2 infection  and should not be used as the sole basis for treatment or other  patient management decisions.  A negative result may occur with  improper specimen collection / handling, submission of specimen other  than nasopharyngeal swab, presence of viral mutation(s) within the  areas targeted by this assay, and inadequate number of viral copies  (<250 copies / mL). A negative result must be combined with clinical  observations, patient history, and epidemiological information. If result is POSITIVE SARS-CoV-2 target nucleic acids are DETECTED. The SARS-CoV-2 RNA is generally detectable in upper and lower  respiratory specimens dur ing the acute phase of infection.  Positive  results are indicative of active infection with SARS-CoV-2.   Clinical  correlation with patient history and other diagnostic information is  necessary to determine patient infection status.  Positive results do  not rule out bacterial infection or co-infection with other viruses. If result is PRESUMPTIVE POSTIVE SARS-CoV-2 nucleic acids MAY BE PRESENT.   A presumptive positive result was obtained on the submitted specimen  and confirmed on repeat testing.  While 2019 novel coronavirus  (SARS-CoV-2) nucleic acids may be present in the submitted sample  additional confirmatory testing may be necessary for epidemiological  and / or clinical management purposes  to differentiate between  SARS-CoV-2 and other Sarbecovirus currently known to infect humans.  If clinically indicated additional testing with an alternate test  methodology [phone]([id number]) is advised. The SARS-CoV-2 RNA is generally  detectable in upper and lower respiratory sp ecimens during the acute  phase of infection. The expected result is Negative. Fact Sheet for Patients:  BoilerBrush.com.cyhttps://www.fda.gov/media/136312/download Fact Sheet for Healthcare Providers: https://pope.com/https://www.fda.gov/media/136313/download This test is not yet approved or cleared by the [place] FDA and has been authorized for detection and/or diagnosis of SARS-CoV-2 by FDA under an Emergency Use Authorization (EUA).  This EUA will remain in effect (meaning this test can be used) for  the duration of the COVID-19 declaration under Section 564(b)(1) of the Act, 21 U.S.C. section 360bbb-3(b)(1), unless the authorization is terminated or revoked sooner. Performed at [facility], [address] [place], [place] [address]   Surgical PCR screen     Status: None   Collection Time: 12/08/18  1:44 AM   Specimen: Nasal Mucosa; Nasal Swab  Result Value Ref Range Status   MRSA, PCR NEGATIVE NEGATIVE Final   Staphylococcus aureus NEGATIVE NEGATIVE Final    Comment: (NOTE) The Xpert SA Assay (FDA approved for NASAL specimens in  patients 53 years of age and older), is one component of a comprehensive surveillance program. It is not intended to diagnose infection nor to guide or monitor treatment. Performed at [facility], [address], [place], [place] [address]          Radiology Studies: Dg Chest 1 View  Result Date: 12/07/2018 CLINICAL DATA:  Preop.  Pain status post fall EXAM: CHEST  1 VIEW COMPARISON:  02/16/2016 FINDINGS: There are chronic lung changes at the lung bases bilaterally favored to represent atelectasis or scarring. There is likely a background of emphysematous changes. There is no pneumothorax. No large pleural effusion. The heart size is normal. Aortic calcifications are noted. IMPRESSION: 1. No acute cardiopulmonary process identified. 2. Probable underlying emphysema. Electronically Signed   By: Katherine Mantle M.D.   On: 12/07/2018 18:39   Dg Knee 2 Views Right  Result Date: 12/07/2018 CLINICAL DATA:  Right leg pain EXAM: RIGHT KNEE - 1-2 VIEW COMPARISON:  None. FINDINGS: There is no acute displaced fracture. No dislocation. Mild degenerative changes are noted. There is no significant joint effusion. IMPRESSION: No acute osseous abnormality detected. Electronically Signed   By: Katherine Mantle M.D.   On: 12/07/2018 18:35   Ct Head Wo Contrast  Result Date: 12/07/2018 CLINICAL DATA:  Acute pain due to trauma EXAM: CT HEAD WITHOUT CONTRAST CT MAXILLOFACIAL WITHOUT CONTRAST CT CERVICAL SPINE WITHOUT CONTRAST TECHNIQUE: Multidetector CT imaging of the head, cervical spine, and maxillofacial structures were performed using the standard protocol without intravenous contrast. Multiplanar CT image reconstructions of the cervical spine and maxillofacial structures were also generated. COMPARISON:  CT head dated June 11, 2018 FINDINGS: CT HEAD FINDINGS Brain: No evidence of acute infarction, hemorrhage, hydrocephalus, extra-axial collection or mass lesion/mass effect. Again noted is global  atrophy and chronic microvascular ischemic changes. Vascular: No hyperdense vessel or unexpected calcification. Skull: Normal. Negative for fracture or focal lesion. There is frontal scalp swelling without evidence for an underlying fracture. Other: None. CT MAXILLOFACIAL FINDINGS Osseous: No fracture or mandibular dislocation. No destructive process. Orbits: Negative. No traumatic or inflammatory finding. Sinuses: Clear. Soft tissues: There is right frontal and right periorbital soft tissue swelling without evidence for an underlying fracture. CT CERVICAL SPINE FINDINGS Alignment: Normal. Skull base and vertebrae: No acute fracture. No primary bone lesion or focal pathologic process. Soft tissues and spinal canal: No prevertebral fluid or swelling. No visible canal hematoma. Disc levels: There is multilevel disc height loss throughout the cervical spine, greatest at the lower cervical levels. Upper chest: There may be a fracture of the posterior fourth rib on the right. This is only partially visualized. Other: None IMPRESSION: 1. No acute intracranial abnormality. 2. No facial fracture. 3. No acute cervical spine fracture. 4. Right frontal and periorbital soft tissue swelling without evidence for a fracture. 5. Possible nondisplaced fracture involving the posterior fourth rib on the right. This is only partially  visualized. Electronically Signed   By: Katherine Mantle M.D.   On: 12/07/2018 19:37   Ct Cervical Spine Wo Contrast  Result Date: 12/07/2018 CLINICAL DATA:  Acute pain due to trauma EXAM: CT HEAD WITHOUT CONTRAST CT MAXILLOFACIAL WITHOUT CONTRAST CT CERVICAL SPINE WITHOUT CONTRAST TECHNIQUE: Multidetector CT imaging of the head, cervical spine, and maxillofacial structures were performed using the standard protocol without intravenous contrast. Multiplanar CT image reconstructions of the cervical spine and maxillofacial structures were also generated. COMPARISON:  CT head dated June 11, 2018  FINDINGS: CT HEAD FINDINGS Brain: No evidence of acute infarction, hemorrhage, hydrocephalus, extra-axial collection or mass lesion/mass effect. Again noted is global atrophy and chronic microvascular ischemic changes. Vascular: No hyperdense vessel or unexpected calcification. Skull: Normal. Negative for fracture or focal lesion. There is frontal scalp swelling without evidence for an underlying fracture. Other: None. CT MAXILLOFACIAL FINDINGS Osseous: No fracture or mandibular dislocation. No destructive process. Orbits: Negative. No traumatic or inflammatory finding. Sinuses: Clear. Soft tissues: There is right frontal and right periorbital soft tissue swelling without evidence for an underlying fracture. CT CERVICAL SPINE FINDINGS Alignment: Normal. Skull base and vertebrae: No acute fracture. No primary bone lesion or focal pathologic process. Soft tissues and spinal canal: No prevertebral fluid or swelling. No visible canal hematoma. Disc levels: There is multilevel disc height loss throughout the cervical spine, greatest at the lower cervical levels. Upper chest: There may be a fracture of the posterior fourth rib on the right. This is only partially visualized. Other: None IMPRESSION: 1. No acute intracranial abnormality. 2. No facial fracture. 3. No acute cervical spine fracture. 4. Right frontal and periorbital soft tissue swelling without evidence for a fracture. 5. Possible nondisplaced fracture involving the posterior fourth rib on the right. This is only partially visualized. Electronically Signed   By: Katherine Mantle M.D.   On: 12/07/2018 19:37   Pelvis Portable  Result Date: 12/08/2018 CLINICAL DATA:  Fracture. EXAM: PORTABLE PELVIS 1-2 VIEWS COMPARISON:  12/07/2018. FINDINGS: Diffuse osteopenia and degenerative change. ORIF right hip fracture. No other fractures identified. No evidence of dislocation. Pelvic calcifications consistent phleboliths. IMPRESSION: 1. ORIF right hip. Hardware  intact. Anatomic alignment. No other acute fractures identified. 2.  Diffuse osteopenia degenerative change. 3.  Aortoiliac atherosclerotic vascular disease. Electronically Signed   By: Maisie Fus  Register   On: 12/08/2018 12:31   Dg C-arm 1-60 Min  Result Date: 12/08/2018 CLINICAL DATA:  ORIF of right femoral fracture EXAM: RIGHT FEMUR 2 VIEWS; DG C-ARM 1-60 MIN COMPARISON:  None. FLUOROSCOPY TIME:  Radiation Exposure Index (as provided by the fluoroscopic device): Not available If the device does not provide the exposure index: Fluoroscopy Time:  1 minutes 23 seconds Number of Acquired Images:  4 FINDINGS: Medullary rod is noted within the right femur. Proximal and distal fixation screws are seen. The fracture fragments are in near anatomic alignment. IMPRESSION: Status post ORIF of right femoral fracture Electronically Signed   By: Alcide Clever M.D.   On: 12/08/2018 09:58   Dg Hip Unilat W Or Wo Pelvis 2-3 Views Right  Result Date: 12/07/2018 CLINICAL DATA:  Pain EXAM: DG HIP (WITH OR WITHOUT PELVIS) 2-3V RIGHT COMPARISON:  None. FINDINGS: There is an acute displaced comminuted intratrochanteric fracture of the proximal right femur. There is no dislocation. There are degenerative changes of both hips. IMPRESSION: Acute intratrochanteric fracture of the right femur. Electronically Signed   By: Katherine Mantle M.D.   On: 12/07/2018 18:34   Dg Femur, Min  2 Views Right  Result Date: 12/08/2018 CLINICAL DATA:  ORIF of right femoral fracture EXAM: RIGHT FEMUR 2 VIEWS; DG C-ARM 1-60 MIN COMPARISON:  None. FLUOROSCOPY TIME:  Radiation Exposure Index (as provided by the fluoroscopic device): Not available If the device does not provide the exposure index: Fluoroscopy Time:  1 minutes 23 seconds Number of Acquired Images:  4 FINDINGS: Medullary rod is noted within the right femur. Proximal and distal fixation screws are seen. The fracture fragments are in near anatomic alignment. IMPRESSION: Status post ORIF of  right femoral fracture Electronically Signed   By: Alcide CleverMark  Lukens M.D.   On: 12/08/2018 09:58   Dg Femur Port, Min 2 Views Right  Result Date: 12/08/2018 CLINICAL DATA:  Fracture EXAM: RIGHT FEMUR PORTABLE 2 VIEW COMPARISON:  12/08/2018 FINDINGS: Interval intramedullary rod and nail fixation of an intertrochanteric fracture of the right femoral neck with displaced fragments of the lesser trochanter. There is otherwise anatomic alignment of the fracture. Expected postoperative soft tissue changes of the overlying right thigh. IMPRESSION: Interval intramedullary rod and nail fixation of an intertrochanteric fracture of the right femoral neck with displaced fragments of the lesser trochanter. There is otherwise anatomic alignment of the fracture. Expected postoperative soft tissue changes of the overlying right thigh. Electronically Signed   By: Lauralyn PrimesAlex  Bibbey M.D.   On: 12/08/2018 12:32   Ct Maxillofacial Wo Contrast  Result Date: 12/07/2018 CLINICAL DATA:  Acute pain due to trauma EXAM: CT HEAD WITHOUT CONTRAST CT MAXILLOFACIAL WITHOUT CONTRAST CT CERVICAL SPINE WITHOUT CONTRAST TECHNIQUE: Multidetector CT imaging of the head, cervical spine, and maxillofacial structures were performed using the standard protocol without intravenous contrast. Multiplanar CT image reconstructions of the cervical spine and maxillofacial structures were also generated. COMPARISON:  CT head dated June 11, 2018 FINDINGS: CT HEAD FINDINGS Brain: No evidence of acute infarction, hemorrhage, hydrocephalus, extra-axial collection or mass lesion/mass effect. Again noted is global atrophy and chronic microvascular ischemic changes. Vascular: No hyperdense vessel or unexpected calcification. Skull: Normal. Negative for fracture or focal lesion. There is frontal scalp swelling without evidence for an underlying fracture. Other: None. CT MAXILLOFACIAL FINDINGS Osseous: No fracture or mandibular dislocation. No destructive process. Orbits:  Negative. No traumatic or inflammatory finding. Sinuses: Clear. Soft tissues: There is right frontal and right periorbital soft tissue swelling without evidence for an underlying fracture. CT CERVICAL SPINE FINDINGS Alignment: Normal. Skull base and vertebrae: No acute fracture. No primary bone lesion or focal pathologic process. Soft tissues and spinal canal: No prevertebral fluid or swelling. No visible canal hematoma. Disc levels: There is multilevel disc height loss throughout the cervical spine, greatest at the lower cervical levels. Upper chest: There may be a fracture of the posterior fourth rib on the right. This is only partially visualized. Other: None IMPRESSION: 1. No acute intracranial abnormality. 2. No facial fracture. 3. No acute cervical spine fracture. 4. Right frontal and periorbital soft tissue swelling without evidence for a fracture. 5. Possible nondisplaced fracture involving the posterior fourth rib on the right. This is only partially visualized. Electronically Signed   By: Katherine Mantlehristopher  Green M.D.   On: 12/07/2018 19:37        Scheduled Meds:  acetaminophen  650 mg Oral Q6H WA   aspirin  81 mg Oral Daily   atorvastatin  20 mg Oral Daily   calcium carbonate  1 tablet Oral Q breakfast   cholecalciferol  2,000 Units Oral Daily   docusate sodium  100 mg Oral BID   [  START ON 12/09/2018] enoxaparin (LOVENOX) injection  40 mg Subcutaneous Q24H   escitalopram  10 mg Oral Daily   memantine  21 mg Oral Daily   senna  1 tablet Oral BID   Continuous Infusions:   ceFAZolin (ANCEF) IV       LOS: 1 day   Time spent= 35 mins    Elexius Minar Joline Maxcy, MD [facility]  If 7PM-7AM, please contact night-coverage [email or web address] 12/08/2018, 1:35 PM

## 2018-12-09 ENCOUNTER — Other Ambulatory Visit: Payer: Self-pay

## 2018-12-09 ENCOUNTER — Encounter (HOSPITAL_COMMUNITY): Payer: Self-pay | Admitting: General Practice

## 2018-12-09 DIAGNOSIS — S0990XA Unspecified injury of head, initial encounter: Secondary | ICD-10-CM

## 2018-12-09 DIAGNOSIS — T148XXA Other injury of unspecified body region, initial encounter: Secondary | ICD-10-CM

## 2018-12-09 DIAGNOSIS — S72001A Fracture of unspecified part of neck of right femur, initial encounter for closed fracture: Secondary | ICD-10-CM

## 2018-12-09 LAB — BASIC METABOLIC PANEL
Anion gap: 4 — ABNORMAL LOW (ref 5–15)
BUN: 19 mg/dL (ref 8–23)
CO2: 28 mmol/L (ref 22–32)
Calcium: 8.6 mg/dL — ABNORMAL LOW (ref 8.9–10.3)
Chloride: 101 mmol/L (ref 98–111)
Creatinine, Ser: 0.98 mg/dL (ref 0.44–1.00)
GFR calc Af Amer: >60 mL/min (ref >60)
GFR calc non Af Amer: 57 mL/min — ABNORMAL LOW (ref >60)
Glucose, Bld: 137 mg/dL — ABNORMAL HIGH (ref 70–99)
Potassium: 4.1 mmol/L (ref 3.5–5.1)
Sodium: 133 mmol/L — ABNORMAL LOW (ref 135–145)

## 2018-12-09 LAB — VITAMIN D 25 HYDROXY (VIT D DEFICIENCY, FRACTURES): Vit D, 25-Hydroxy: 20.2 ng/mL — ABNORMAL LOW (ref 30.0–100.0)

## 2018-12-09 LAB — PREPARE RBC (CROSSMATCH)

## 2018-12-09 LAB — CBC
HCT: 24 % — ABNORMAL LOW (ref 36.0–46.0)
Hemoglobin: 7.5 g/dL — ABNORMAL LOW (ref 12.0–15.0)
MCH: 29.3 pg (ref 26.0–34.0)
MCHC: 31.3 g/dL (ref 30.0–36.0)
MCV: 93.8 fL (ref 80.0–100.0)
Platelets: 213 10*3/uL (ref 150–400)
RBC: 2.56 MIL/uL — ABNORMAL LOW (ref 3.87–5.11)
RDW: 12.6 % (ref 11.5–15.5)
WBC: 10.4 10*3/uL (ref 4.0–10.5)
nRBC: 0 % (ref 0.0–0.2)

## 2018-12-09 LAB — MAGNESIUM: Magnesium: 2.1 mg/dL (ref 1.7–2.4)

## 2018-12-09 MED ORDER — SODIUM CHLORIDE 0.9% IV SOLUTION
Freq: Once | INTRAVENOUS | Status: AC
  Administered 2018-12-09: 09:00:00 via INTRAVENOUS

## 2018-12-09 MED ORDER — OXYCODONE HCL 5 MG PO TABS: 2.5000 mg | ORAL_TABLET | Freq: Four times a day (QID) | ORAL | Status: DC | PRN

## 2018-12-09 MED ORDER — VITAMIN D 25 MCG (1000 UNIT) PO TABS
2000.0000 [IU] | ORAL_TABLET | Freq: Two times a day (BID) | ORAL | Status: DC
  Administered 2018-12-09 – 2018-12-11 (×4): 2000 [IU] via ORAL
  Filled 2018-12-09 (×4): qty 2

## 2018-12-09 MED ORDER — VITAMIN C 500 MG PO TABS
500.0000 mg | ORAL_TABLET | Freq: Every day | ORAL | Status: DC
  Administered 2018-12-10 – 2018-12-11 (×2): 500 mg via ORAL
  Filled 2018-12-09 (×2): qty 1

## 2018-12-09 NOTE — [place] FL2 (Signed)
[place] MEDICAID FL2 LEVEL OF CARE SCREENING TOOL     IDENTIFICATION  Patient Name: Tara Henderson Birthdate: 1946-02-05 Sex: female Admission Date (Current Location): 12/07/2018  [facility] and [place] Number:  [employer] and Address:  [facility] [place], [place] [address] [place], [place] [address]      Provider Number: [id number]  Attending Physician Name and Address:  Mendel Corning, MD  Relative Name and Phone Number:  Darryl Lent; daughter; [phone]    Current Level of Care: Hospital Recommended Level of Care: [place] Prior Approval Number:    Date Approved/Denied:   PASRR Number: [id number]  Discharge Plan: SNF    Current Diagnoses: Patient Active Problem List   Diagnosis Date Noted  . Displaced intertrochanteric fracture of right femur ([place]) 12/07/2018  . Moderate dementia without behavioral disturbance ([place]) 11/09/2013    Orientation RESPIRATION BLADDER Height & Weight     Self  O2(2L nasal canula) Continent, External catheter Weight: 141 lb 1.5 oz (64 kg)(from 11/09/2013) Height:  5\' 3"  (160 cm)  BEHAVIORAL SYMPTOMS/MOOD NEUROLOGICAL BOWEL NUTRITION STATUS      Continent Diet(see discharge summary)  AMBULATORY STATUS COMMUNICATION OF NEEDS Skin   Extensive Assist Verbally Surgical wounds, Other (Comment)(incision on right leg with adhesive bandage)                       Personal Care Assistance Level of Assistance  Bathing, Feeding, Dressing Bathing Assistance: Maximum assistance Feeding assistance: Limited assistance Dressing Assistance: Maximum assistance     Functional Limitations Info  Sight, Hearing, Speech Sight Info: Adequate Hearing Info: Adequate Speech Info: Adequate    SPECIAL CARE FACTORS FREQUENCY  PT (By licensed PT), OT (By licensed OT)     PT Frequency: 5x week OT Frequency: 5x week            Contractures Contractures Info: Not present    Additional Factors Info   Code Status, Allergies, Psychotropic Code Status Info: DNR Allergies Info: Keflex (Cephalexin), Penicillins Psychotropic Info: escitalopram (LEXAPRO) tablet 10 mg daily PO; memantine (NAMENDA XR) 24 hr capsule 21 mg daily PO         Current Medications (12/09/2018):  This is the current hospital active medication list Current Facility-Administered Medications  Medication Dose Route Frequency Provider Last Rate Last Dose  . acetaminophen (TYLENOL) tablet 650 mg  650 mg Oral Q6H WA Ainsley Spinner, PA-C   650 mg at 12/09/18 0149  . aspirin chewable tablet 81 mg  81 mg Oral Daily Ainsley Spinner, PA-C      . atorvastatin (LIPITOR) tablet 20 mg  20 mg Oral Daily Ainsley Spinner, PA-C   20 mg at 12/08/18 1153  . calcium carbonate (OS-CAL - dosed in mg of elemental calcium) tablet 500 mg of elemental calcium  1 tablet Oral Q breakfast Ainsley Spinner, PA-C   500 mg of elemental calcium at 12/08/18 1153  . cholecalciferol (VITAMIN D3) tablet 2,000 Units  2,000 Units Oral BID Ainsley Spinner, PA-C      . docusate sodium (COLACE) capsule 100 mg  100 mg Oral BID Ainsley Spinner, PA-C   100 mg at 12/08/18 2012  . enoxaparin (LOVENOX) injection 40 mg  40 mg Subcutaneous Q24H Ainsley Spinner, PA-C   40 mg at 12/09/18 0912  . escitalopram (LEXAPRO) tablet 10 mg  10 mg Oral Daily Ainsley Spinner, PA-C   10 mg at 12/08/18 1154  . memantine (NAMENDA XR) 24 hr capsule  21 mg  21 mg Oral Daily Montez MoritaPaul, Keith, PA-C      . menthol-cetylpyridinium (CEPACOL) lozenge 3 mg  1 lozenge Oral PRN Montez MoritaPaul, Keith, PA-C       Or  . phenol (CHLORASEPTIC) mouth spray 1 spray  1 spray Mouth/Throat PRN Montez MoritaPaul, Keith, PA-C      . metoCLOPramide (REGLAN) tablet 5-10 mg  5-10 mg Oral Q8H PRN Montez MoritaPaul, Keith, PA-C       Or  . metoCLOPramide (REGLAN) injection 5-10 mg  5-10 mg Intravenous Q8H PRN Montez MoritaPaul, Keith, PA-C      . ondansetron [facility](ZOFRAN) tablet 4 mg  4 mg Oral Q6H PRN Montez MoritaPaul, Keith, PA-C       Or  . ondansetron [facility](ZOFRAN) injection 4 mg  4 mg Intravenous Q6H PRN Montez MoritaPaul,  Keith, PA-C      . oxyCODONE (Oxy IR/ROXICODONE) immediate release tablet 2.5 mg  2.5 mg Oral Q6H PRN Rai, Ripudeep K, MD      . polyethylene glycol (MIRALAX / GLYCOLAX) packet 17 g  17 g Oral Daily PRN Amin, Loura HaltAnkit Chirag, MD      . senna (SENOKOT) tablet 8.6 mg  1 tablet Oral BID Montez MoritaPaul, Keith, PA-C   8.6 mg at 12/08/18 2011  . senna-docusate (Senokot-S) tablet 2 tablet  2 tablet Oral QHS PRN Amin, Ankit Chirag, MD      . vitamin C (ASCORBIC ACID) tablet 500 mg  500 mg Oral Daily Montez MoritaPaul, Keith, PA-C         Discharge Medications: Please see discharge summary for a list of discharge medications.  Relevant Imaging Results:  Relevant Lab Results:   Additional Information SS#[id number]  Lurlie Wigen H [place], [place]

## 2018-12-09 NOTE — Progress Notes (Addendum)
[facility]                                                                              Patient Demographics  Tara Henderson, is a 73 y.o. female, DOB - 31-Mar-1946, ZOX:[id number]  Admit date - 12/07/2018   Admitting Physician Marcelo Baldy, MD  Outpatient Primary MD for the patient is Ron Parker, MD  Outpatient specialists:   LOS - 2  days   Medical records reviewed and are as summarized below:    Chief Complaint  Patient presents with   Hip Injury   Fall       Brief summary   73 year old female with progressive dementia, essential hypertension resides at a memory care unit came to the hospital after sustaining a fall.  Patient has been having falls with her facility recently but this time started experiencing right-sided hip pain.  She was found to have acute intertrochanteric left femoral neck fracture.  There was also posterior rib fracture on the left side and right-sided facial bone swelling.    Assessment & Plan    Mechanical fall with Displaced intertrochanteric fracture of right femur (HCC) Undisplaced posterior fourth rib fracture on the right -Trauma work-up in the ED, orthopedics was consulted -Status post IM intertrochanteric nailing surgery on 8/31, postop day #1 -Pain control, DVT prophylaxis per orthopedics  Acute blood loss anemia -Likely postop anemia, hemoglobin 7.5, will transfuse 1 unit packed RBCs  Advanced dementia -Continue Namenda, Lexapro - somnolent, will decrease oxycodone to 2.5 mg 6 hours for only severe pain otherwise continue scheduled Tylenol   Code Status: DNR DVT Prophylaxis: Lovenox Family Communication:   Disposition Plan: Postop day #1, PT OT pending, will remain inpatient  Time Spent in minutes 25 minutes  Procedures:  Intramedullary nailing of the right hip 8/31  Consultants:   Orthopedics  Antimicrobials:   Anti-infectives (From admission, onward)   Start     Dose/Rate Route Frequency  Ordered Stop   12/08/18 1430  ceFAZolin (ANCEF) IVPB 2g/100 mL premix    Note to Pharmacy: Ancef given pre-op   2 g 200 mL/hr over 30 Minutes Intravenous Every 6 hours 12/08/18 1128 12/08/18 2040   12/08/18 0600  ceFAZolin (ANCEF) IVPB 2g/100 mL premix     2 g 200 mL/hr over 30 Minutes Intravenous On call to O.R. 12/07/18 2117 12/08/18 8119          Medications  Scheduled Meds:  acetaminophen  650 mg Oral Q6H WA   aspirin  81 mg Oral Daily   atorvastatin  20 mg Oral Daily   calcium carbonate  1 tablet Oral Q breakfast   cholecalciferol  2,000 Units Oral BID   docusate sodium  100 mg Oral BID   enoxaparin (LOVENOX) injection  40 mg Subcutaneous Q24H   escitalopram  10 mg Oral Daily   memantine  21 mg Oral Daily   senna  1 tablet Oral BID   vitamin C  500 mg Oral Daily   Continuous Infusions: PRN Meds:.menthol-cetylpyridinium **OR** phenol, metoCLOPramide **OR** metoCLOPramide (REGLAN) injection, ondansetron **OR** ondansetron (ZOFRAN) IV, oxyCODONE, polyethylene glycol, senna-docusate      Subjective:  Tara Henderson was seen and examined today.  Somnolent, difficult to obtain review of system from the patient.  Overnight no acute issues.  Hemoglobin low.  No bleeding, nausea vomiting or diarrhea.  No fevers  Objective:   Vitals:   12/09/18 0034 12/09/18 0831 12/09/18 0845 12/09/18 1145  BP: 112/65 104/66 (!) 109/45 (!) 120/59  Pulse: 84 73 79 76  Resp: 18 16 17 16   Temp: 98.2 F (36.8 C) 98 F (36.7 C) 98.6 F (37 C)   TempSrc: Oral Axillary Oral   SpO2: 100% 96% 97% 97%    Intake/Output Summary (Last 24 hours) at 12/09/2018 1301 Last data filed at 12/09/2018 1153 Gross per 24 hour  Intake 560.83 ml  Output 0 ml  Net 560.83 ml     Wt Readings from Last 3 Encounters:  11/09/13 64 kg     Exam  General: Somnolent, somewhat arousable but does not respond to commands  Eyes:  HEENT:  Atraumatic, normocephalic  Cardiovascular: S1 S2  auscultated, no murmurs, RRR  Respiratory: CTA B anteriorly  Gastrointestinal: Soft, nontender, nondistended, + bowel sounds  Ext: no pedal edema bilaterally  Neuro: Does not follow commands  Musculoskeletal: No digital cyanosis, clubbing  Skin: No rashes  Psych: Somnolent   Data Reviewed:  I have personally reviewed following labs and imaging studies  Micro Results Recent Results (from the past 240 hour(s))  SARS Coronavirus 2 [facility](Hospital order, Performed in [facility] hospital lab) Nasopharyngeal Nasopharyngeal Swab     Status: None   Collection Time: 12/07/18  7:20 PM   Specimen: Nasopharyngeal Swab  Result Value Ref Range Status   SARS Coronavirus 2 NEGATIVE NEGATIVE Final    Comment: (NOTE) If result is NEGATIVE SARS-CoV-2 target nucleic acids are NOT DETECTED. The SARS-CoV-2 RNA is generally detectable in upper and lower  respiratory specimens during the acute phase of infection. The lowest  concentration of SARS-CoV-2 viral copies this assay can detect is 250  copies / mL. A negative result does not preclude SARS-CoV-2 infection  and should not be used as the sole basis for treatment or other  patient management decisions.  A negative result may occur with  improper specimen collection / handling, submission of specimen other  than nasopharyngeal swab, presence of viral mutation(s) within the  areas targeted by this assay, and inadequate number of viral copies  (<250 copies / mL). A negative result must be combined with clinical  observations, patient history, and epidemiological information. If result is POSITIVE SARS-CoV-2 target nucleic acids are DETECTED. The SARS-CoV-2 RNA is generally detectable in upper and lower  respiratory specimens dur ing the acute phase of infection.  Positive  results are indicative of active infection with SARS-CoV-2.  Clinical  correlation with patient history and other diagnostic information is  necessary to determine patient  infection status.  Positive results do  not rule out bacterial infection or co-infection with other viruses. If result is PRESUMPTIVE POSTIVE SARS-CoV-2 nucleic acids MAY BE PRESENT.   A presumptive positive result was obtained on the submitted specimen  and confirmed on repeat testing.  While 2019 novel coronavirus  (SARS-CoV-2) nucleic acids may be present in the submitted sample  additional confirmatory testing may be necessary for epidemiological  and / or clinical management purposes  to differentiate between  SARS-CoV-2 and other Sarbecovirus currently known to infect humans.  If clinically indicated additional testing with an alternate test  methodology [phone]([id number]) is advised. The SARS-CoV-2 RNA is generally  detectable in upper  and lower respiratory sp ecimens during the acute  phase of infection. The expected result is Negative. Fact Sheet for Patients:  BoilerBrush.com.cy Fact Sheet for Healthcare Providers: https://pope.com/ This test is not yet approved or cleared by the [place] FDA and has been authorized for detection and/or diagnosis of SARS-CoV-2 by FDA under an Emergency Use Authorization (EUA).  This EUA will remain in effect (meaning this test can be used) for the duration of the COVID-19 declaration under Section 564(b)(1) of the Act, 21 U.S.C. section 360bbb-3(b)(1), unless the authorization is terminated or revoked sooner. Performed at [facility], [address], [place], [place] [address]   Surgical PCR screen     Status: None   Collection Time: 12/08/18  1:44 AM   Specimen: Nasal Mucosa; Nasal Swab  Result Value Ref Range Status   MRSA, PCR NEGATIVE NEGATIVE Final   Staphylococcus aureus NEGATIVE NEGATIVE Final    Comment: (NOTE) The Xpert SA Assay (FDA approved for NASAL specimens in patients 98 years of age and older), is one component of a comprehensive surveillance program. It is not intended  to diagnose infection nor to guide or monitor treatment. Performed at [facility], [address], [place], [place] [address]     Radiology Reports Dg Chest 1 View  Result Date: 12/07/2018 CLINICAL DATA:  Preop.  Pain status post fall EXAM: CHEST  1 VIEW COMPARISON:  02/16/2016 FINDINGS: There are chronic lung changes at the lung bases bilaterally favored to represent atelectasis or scarring. There is likely a background of emphysematous changes. There is no pneumothorax. No large pleural effusion. The heart size is normal. Aortic calcifications are noted. IMPRESSION: 1. No acute cardiopulmonary process identified. 2. Probable underlying emphysema. Electronically Signed   By: Katherine Mantle M.D.   On: 12/07/2018 18:39   Dg Knee 2 Views Right  Result Date: 12/07/2018 CLINICAL DATA:  Right leg pain EXAM: RIGHT KNEE - 1-2 VIEW COMPARISON:  None. FINDINGS: There is no acute displaced fracture. No dislocation. Mild degenerative changes are noted. There is no significant joint effusion. IMPRESSION: No acute osseous abnormality detected. Electronically Signed   By: Katherine Mantle M.D.   On: 12/07/2018 18:35   Ct Head Wo Contrast  Result Date: 12/07/2018 CLINICAL DATA:  Acute pain due to trauma EXAM: CT HEAD WITHOUT CONTRAST CT MAXILLOFACIAL WITHOUT CONTRAST CT CERVICAL SPINE WITHOUT CONTRAST TECHNIQUE: Multidetector CT imaging of the head, cervical spine, and maxillofacial structures were performed using the standard protocol without intravenous contrast. Multiplanar CT image reconstructions of the cervical spine and maxillofacial structures were also generated. COMPARISON:  CT head dated June 11, 2018 FINDINGS: CT HEAD FINDINGS Brain: No evidence of acute infarction, hemorrhage, hydrocephalus, extra-axial collection or mass lesion/mass effect. Again noted is global atrophy and chronic microvascular ischemic changes. Vascular: No hyperdense vessel or unexpected calcification. Skull: Normal.  Negative for fracture or focal lesion. There is frontal scalp swelling without evidence for an underlying fracture. Other: None. CT MAXILLOFACIAL FINDINGS Osseous: No fracture or mandibular dislocation. No destructive process. Orbits: Negative. No traumatic or inflammatory finding. Sinuses: Clear. Soft tissues: There is right frontal and right periorbital soft tissue swelling without evidence for an underlying fracture. CT CERVICAL SPINE FINDINGS Alignment: Normal. Skull base and vertebrae: No acute fracture. No primary bone lesion or focal pathologic process. Soft tissues and spinal canal: No prevertebral fluid or swelling. No visible canal hematoma. Disc levels: There is multilevel disc height loss throughout the cervical spine, greatest at the lower  cervical levels. Upper chest: There may be a fracture of the posterior fourth rib on the right. This is only partially visualized. Other: None IMPRESSION: 1. No acute intracranial abnormality. 2. No facial fracture. 3. No acute cervical spine fracture. 4. Right frontal and periorbital soft tissue swelling without evidence for a fracture. 5. Possible nondisplaced fracture involving the posterior fourth rib on the right. This is only partially visualized. Electronically Signed   By: Katherine Mantlehristopher  Green M.D.   On: 12/07/2018 19:37   Ct Cervical Spine Wo Contrast  Result Date: 12/07/2018 CLINICAL DATA:  Acute pain due to trauma EXAM: CT HEAD WITHOUT CONTRAST CT MAXILLOFACIAL WITHOUT CONTRAST CT CERVICAL SPINE WITHOUT CONTRAST TECHNIQUE: Multidetector CT imaging of the head, cervical spine, and maxillofacial structures were performed using the standard protocol without intravenous contrast. Multiplanar CT image reconstructions of the cervical spine and maxillofacial structures were also generated. COMPARISON:  CT head dated June 11, 2018 FINDINGS: CT HEAD FINDINGS Brain: No evidence of acute infarction, hemorrhage, hydrocephalus, extra-axial collection or mass  lesion/mass effect. Again noted is global atrophy and chronic microvascular ischemic changes. Vascular: No hyperdense vessel or unexpected calcification. Skull: Normal. Negative for fracture or focal lesion. There is frontal scalp swelling without evidence for an underlying fracture. Other: None. CT MAXILLOFACIAL FINDINGS Osseous: No fracture or mandibular dislocation. No destructive process. Orbits: Negative. No traumatic or inflammatory finding. Sinuses: Clear. Soft tissues: There is right frontal and right periorbital soft tissue swelling without evidence for an underlying fracture. CT CERVICAL SPINE FINDINGS Alignment: Normal. Skull base and vertebrae: No acute fracture. No primary bone lesion or focal pathologic process. Soft tissues and spinal canal: No prevertebral fluid or swelling. No visible canal hematoma. Disc levels: There is multilevel disc height loss throughout the cervical spine, greatest at the lower cervical levels. Upper chest: There may be a fracture of the posterior fourth rib on the right. This is only partially visualized. Other: None IMPRESSION: 1. No acute intracranial abnormality. 2. No facial fracture. 3. No acute cervical spine fracture. 4. Right frontal and periorbital soft tissue swelling without evidence for a fracture. 5. Possible nondisplaced fracture involving the posterior fourth rib on the right. This is only partially visualized. Electronically Signed   By: Katherine Mantlehristopher  Green M.D.   On: 12/07/2018 19:37   Pelvis Portable  Result Date: 12/08/2018 CLINICAL DATA:  Fracture. EXAM: PORTABLE PELVIS 1-2 VIEWS COMPARISON:  12/07/2018. FINDINGS: Diffuse osteopenia and degenerative change. ORIF right hip fracture. No other fractures identified. No evidence of dislocation. Pelvic calcifications consistent phleboliths. IMPRESSION: 1. ORIF right hip. Hardware intact. Anatomic alignment. No other acute fractures identified. 2.  Diffuse osteopenia degenerative change. 3.  Aortoiliac  atherosclerotic vascular disease. Electronically Signed   By: Maisie Fushomas  Register   On: 12/08/2018 12:31   Dg C-arm 1-60 Min  Result Date: 12/08/2018 CLINICAL DATA:  ORIF of right femoral fracture EXAM: RIGHT FEMUR 2 VIEWS; DG C-ARM 1-60 MIN COMPARISON:  None. FLUOROSCOPY TIME:  Radiation Exposure Index (as provided by the fluoroscopic device): Not available If the device does not provide the exposure index: Fluoroscopy Time:  1 minutes 23 seconds Number of Acquired Images:  4 FINDINGS: Medullary rod is noted within the right femur. Proximal and distal fixation screws are seen. The fracture fragments are in near anatomic alignment. IMPRESSION: Status post ORIF of right femoral fracture Electronically Signed   By: Alcide CleverMark  Lukens M.D.   On: 12/08/2018 09:58   Dg Hip Unilat W Or Wo Pelvis 2-3 Views Right  Result Date:  12/07/2018 CLINICAL DATA:  Pain EXAM: DG HIP (WITH OR WITHOUT PELVIS) 2-3V RIGHT COMPARISON:  None. FINDINGS: There is an acute displaced comminuted intratrochanteric fracture of the proximal right femur. There is no dislocation. There are degenerative changes of both hips. IMPRESSION: Acute intratrochanteric fracture of the right femur. Electronically Signed   By: Constance Holster M.D.   On: 12/07/2018 18:34   Dg Femur, Min 2 Views Right  Result Date: 12/08/2018 CLINICAL DATA:  ORIF of right femoral fracture EXAM: RIGHT FEMUR 2 VIEWS; DG C-ARM 1-60 MIN COMPARISON:  None. FLUOROSCOPY TIME:  Radiation Exposure Index (as provided by the fluoroscopic device): Not available If the device does not provide the exposure index: Fluoroscopy Time:  1 minutes 23 seconds Number of Acquired Images:  4 FINDINGS: Medullary rod is noted within the right femur. Proximal and distal fixation screws are seen. The fracture fragments are in near anatomic alignment. IMPRESSION: Status post ORIF of right femoral fracture Electronically Signed   By: Inez Catalina M.D.   On: 12/08/2018 09:58   Dg Femur Port, Min 2 Views  Right  Result Date: 12/08/2018 CLINICAL DATA:  Fracture EXAM: RIGHT FEMUR PORTABLE 2 VIEW COMPARISON:  12/08/2018 FINDINGS: Interval intramedullary rod and nail fixation of an intertrochanteric fracture of the right femoral neck with displaced fragments of the lesser trochanter. There is otherwise anatomic alignment of the fracture. Expected postoperative soft tissue changes of the overlying right thigh. IMPRESSION: Interval intramedullary rod and nail fixation of an intertrochanteric fracture of the right femoral neck with displaced fragments of the lesser trochanter. There is otherwise anatomic alignment of the fracture. Expected postoperative soft tissue changes of the overlying right thigh. Electronically Signed   By: Eddie Candle M.D.   On: 12/08/2018 12:32   Ct Maxillofacial Wo Contrast  Result Date: 12/07/2018 CLINICAL DATA:  Acute pain due to trauma EXAM: CT HEAD WITHOUT CONTRAST CT MAXILLOFACIAL WITHOUT CONTRAST CT CERVICAL SPINE WITHOUT CONTRAST TECHNIQUE: Multidetector CT imaging of the head, cervical spine, and maxillofacial structures were performed using the standard protocol without intravenous contrast. Multiplanar CT image reconstructions of the cervical spine and maxillofacial structures were also generated. COMPARISON:  CT head dated June 11, 2018 FINDINGS: CT HEAD FINDINGS Brain: No evidence of acute infarction, hemorrhage, hydrocephalus, extra-axial collection or mass lesion/mass effect. Again noted is global atrophy and chronic microvascular ischemic changes. Vascular: No hyperdense vessel or unexpected calcification. Skull: Normal. Negative for fracture or focal lesion. There is frontal scalp swelling without evidence for an underlying fracture. Other: None. CT MAXILLOFACIAL FINDINGS Osseous: No fracture or mandibular dislocation. No destructive process. Orbits: Negative. No traumatic or inflammatory finding. Sinuses: Clear. Soft tissues: There is right frontal and right periorbital soft  tissue swelling without evidence for an underlying fracture. CT CERVICAL SPINE FINDINGS Alignment: Normal. Skull base and vertebrae: No acute fracture. No primary bone lesion or focal pathologic process. Soft tissues and spinal canal: No prevertebral fluid or swelling. No visible canal hematoma. Disc levels: There is multilevel disc height loss throughout the cervical spine, greatest at the lower cervical levels. Upper chest: There may be a fracture of the posterior fourth rib on the right. This is only partially visualized. Other: None IMPRESSION: 1. No acute intracranial abnormality. 2. No facial fracture. 3. No acute cervical spine fracture. 4. Right frontal and periorbital soft tissue swelling without evidence for a fracture. 5. Possible nondisplaced fracture involving the posterior fourth rib on the right. This is only partially visualized. Electronically Signed   By: Harrell Gave  Green M.D.   On: 12/07/2018 19:37    Lab Data:  CBC: Recent Labs  Lab 12/07/18 1839 12/08/18 1159 12/09/18 0125  WBC 13.6* 13.2* 10.4  NEUTROABS 11.8*  --   --   HGB 12.4 9.2* 7.5*  HCT 39.6 28.8* 24.0*  MCV 94.3 90.9 93.8  PLT 276 246 213   Basic Metabolic Panel: Recent Labs  Lab 12/07/18 1839 12/08/18 1159 12/09/18 0125  NA 139 139 133*  K 4.7 4.3 4.1  CL 103 106 101  CO2 26 25 28   GLUCOSE 166* 166* 137*  BUN 27* 18 19  CREATININE 1.00 0.85 0.98  CALCIUM 8.8* 8.4* 8.6*  MG  --   --  2.1   GFR: CrCl cannot be calculated (Unknown ideal weight.). Liver Function Tests: Recent Labs  Lab 12/07/18 2300  AST 22  ALT 16  ALKPHOS 85  BILITOT 0.6  PROT 6.6  ALBUMIN 3.5   No results for input(s): LIPASE, AMYLASE in the last 168 hours. No results for input(s): AMMONIA in the last 168 hours. Coagulation Profile: Recent Labs  Lab 12/07/18 1839  INR 1.1   Cardiac Enzymes: No results for input(s): CKTOTAL, CKMB, CKMBINDEX, TROPONINI in the last 168 hours. BNP (last 3 results) No results for  input(s): PROBNP in the last 8760 hours. HbA1C: No results for input(s): HGBA1C in the last 72 hours. CBG: No results for input(s): GLUCAP in the last 168 hours. Lipid Profile: No results for input(s): CHOL, HDL, LDLCALC, TRIG, CHOLHDL, LDLDIRECT in the last 72 hours. Thyroid Function Tests: No results for input(s): TSH, T4TOTAL, FREET4, T3FREE, THYROIDAB in the last 72 hours. Anemia Panel: No results for input(s): VITAMINB12, FOLATE, FERRITIN, TIBC, IRON, RETICCTPCT in the last 72 hours. Urine analysis:    Component Value Date/Time   COLORURINE YELLOW 09/24/2016 1644   APPEARANCEUR CLEAR 09/24/2016 1644   LABSPEC 1.021 09/24/2016 1644   PHURINE 5.0 09/24/2016 1644   GLUCOSEU NEGATIVE 09/24/2016 1644   HGBUR NEGATIVE 09/24/2016 1644   BILIRUBINUR NEGATIVE 09/24/2016 1644   KETONESUR NEGATIVE 09/24/2016 1644   PROTEINUR NEGATIVE 09/24/2016 1644   NITRITE NEGATIVE 09/24/2016 1644   LEUKOCYTESUR TRACE (A) 09/24/2016 1644     Jamillia Closson M.D. [facility] 12/09/2018, 1:01 PM  Pager: [phone] Between 7am to 7pm - call Pager - [phone]  After 7pm go to [email or web address] - password [email or web address]  Call night coverage person covering after 7pm

## 2018-12-09 NOTE — Progress Notes (Signed)
Orthopedic Trauma Service Progress Note  Patient ID: Tara Henderson MRN: [id number] DOB/AGE: 74-Jun-1947 74 y.o.  Subjective:  Sleeping NAD  PRBCs hanging    ROS As above   Objective:   VITALS:   Vitals:   12/08/18 2148 12/09/18 0034 12/09/18 0831 12/09/18 0845  BP:  112/65 104/66 (!) 109/45  Pulse:  84 73 79  Resp:  18 16 17   Temp: 98.7 F (37.1 C) 98.2 F (36.8 C) 98 F (36.7 C) 98.6 F (37 C)  TempSrc: Oral Oral Axillary Oral  SpO2:  100% 96% 97%    Estimated body mass index is 24.98 kg/m as calculated from the following:   Height as of 11/09/13: 5\' 3"  (1.6 m).   Weight as of 11/09/13: 64 kg.   Intake/Output      08/31 0701 - 09/01 0700 09/01 0701 - 09/02 0700   I.V. 500    IV Piggyback 350    Total Intake 850    Urine 0    Blood 150    Total Output 150    Net +700         Urine Occurrence 1 x      LABS  Results for orders placed or performed during the hospital encounter of 12/07/18 (from the past 24 hour(s))  CBC     Status: Abnormal   Collection Time: 12/08/18 11:59 AM  Result Value Ref Range   WBC 13.2 (H) 4.0 - 10.5 K/uL   RBC 3.17 (L) 3.87 - 5.11 MIL/uL   Hemoglobin 9.2 (L) 12.0 - 15.0 g/dL   HCT 40.928.8 (L) 81.136.0 - 91.446.0 %   MCV 90.9 80.0 - 100.0 fL   MCH 29.0 26.0 - 34.0 pg   MCHC 31.9 30.0 - 36.0 g/dL   RDW 78.212.2 95.611.5 - 21.315.5 %   Platelets 246 150 - 400 K/uL   nRBC 0.0 0.0 - 0.2 %  Basic metabolic panel     Status: Abnormal   Collection Time: 12/08/18 11:59 AM  Result Value Ref Range   Sodium 139 135 - 145 mmol/L   Potassium 4.3 3.5 - 5.1 mmol/L   Chloride 106 98 - 111 mmol/L   CO2 25 22 - 32 mmol/L   Glucose, Bld 166 (H) 70 - 99 mg/dL   BUN 18 8 - 23 mg/dL   Creatinine, Ser 0.860.85 0.44 - 1.00 mg/dL   Calcium 8.4 (L) 8.9 - 10.3 mg/dL   GFR calc non Af Amer >60 >60 mL/min   GFR calc Af Amer >60 >60 mL/min   Anion gap 8 5 - 15  CBC     Status: Abnormal   Collection  Time: 12/09/18  1:25 AM  Result Value Ref Range   WBC 10.4 4.0 - 10.5 K/uL   RBC 2.56 (L) 3.87 - 5.11 MIL/uL   Hemoglobin 7.5 (L) 12.0 - 15.0 g/dL   HCT 57.824.0 (L) 46.936.0 - 62.946.0 %   MCV 93.8 80.0 - 100.0 fL   MCH 29.3 26.0 - 34.0 pg   MCHC 31.3 30.0 - 36.0 g/dL   RDW 52.812.6 41.311.5 - 24.415.5 %   Platelets 213 150 - 400 K/uL   nRBC 0.0 0.0 - 0.2 %  Basic metabolic panel     Status: Abnormal   Collection Time: 12/09/18  1:25 AM  Result  Value Ref Range   Sodium 133 (L) 135 - 145 mmol/L   Potassium 4.1 3.5 - 5.1 mmol/L   Chloride 101 98 - 111 mmol/L   CO2 28 22 - 32 mmol/L   Glucose, Bld 137 (H) 70 - 99 mg/dL   BUN 19 8 - 23 mg/dL   Creatinine, Ser 0.98 0.44 - 1.00 mg/dL   Calcium 8.6 (L) 8.9 - 10.3 mg/dL   GFR calc non Af Amer 57 (L) >60 mL/min   GFR calc Af Amer >60 >60 mL/min   Anion gap 4 (L) 5 - 15  Magnesium     Status: None   Collection Time: 12/09/18  1:25 AM  Result Value Ref Range   Magnesium 2.1 1.7 - 2.4 mg/dL  Prepare RBC     Status: None   Collection Time: 12/09/18  7:30 AM  Result Value Ref Range   Order Confirmation      ORDER PROCESSED BY BLOOD BANK Performed at [facility], [place] [address] [place], [place] [address]      PHYSICAL EXAM:   Gen: resting comfortably, NAD Ext:       Right Lower Extremity   Dressings stable  Scant drainage but stable  Ext warm   Moving ankle and toes  + DP pulse  Expected swelling   Assessment/Plan: 1 Day Post-Op   Active Problems:   Displaced intertrochanteric fracture of right femur (HCC)   Anti-infectives (From admission, onward)   Start     Dose/Rate Route Frequency Ordered Stop   12/08/18 1430  ceFAZolin (ANCEF) IVPB 2g/100 mL premix    Note to Pharmacy: Ancef given pre-op   2 g 200 mL/hr over 30 Minutes Intravenous Every 6 hours 12/08/18 1128 12/08/18 2040   12/08/18 0600  ceFAZolin (ANCEF) IVPB 2g/100 mL premix     2 g 200 mL/hr over 30 Minutes Intravenous On call to O.R. 12/07/18 2117 12/08/18 6222    .   POD/HD#: 1  73 y/o female s/p fall with R hip fracture  -fall  - R intertrochanteric hip fracture  WBAT with assistance  Dressing changes starting tomorrow  ROM as tolerated R hip and knee  PT/OT  Ice prn for swelling and pain     - Pain management:  Controlled with tylenol   - ABL anemia/Hemodynamics  Getting PRBCs today   Monitor   - Medical issues   Per primary   - DVT/PE prophylaxis:  Recommend lovenox x 21 days   - ID:   periop abx  - Metabolic Bone Disease:  Vitamin d deficiency   Fracture suggestive of osteoporosis     Recommend DEXA in next 4-8 weeks  Continue vitamin d and vitamin c supplementation    Would avoid bisphosphonates in the acute healing phase. Could consider using after fracture united but would likely lean more towards prolia, evenity, tymlos or forteo   - Activity:  WBAT with assistance  - Impediments to fracture healing:  Vitamin d deficiency  Osteoporosis  Dementia    Reported history of osteopetrosis- not sure this is accurate.  xrays do not show characteristic findings for this disease. Will have to discuss with family.  Think this may have been listed in error and might should read osteoporosis    - Dispo:  Therapy   SNF     Jari Pigg, PA-C [phone] (C) 12/09/2018, 10:47 AM  [facility] [place] [address] [phone] (F)

## 2018-12-09 NOTE — Evaluation (Signed)
Physical Therapy Evaluation Patient Details Name: Tara Henderson MRN: [id number] DOB: Jul 24, 1945 Today's Date: 12/09/2018   History of Present Illness  Pt is a 73 y.o. female admitted from SNF on 12/07/18 after falls sustaining R forehead and R hip injury. Workup revealed R femur fx, probable R 4th rib fx. Now s/p R intertrochantric IM nail 8/31. PMH includes dementia, HTN, osteoporosis.  Clinical Impression  Pt presents with an overall decrease in functional mobility secondary to above. Evaluation limited this session secondary to lethargy, requiring max-total assist for rolling and unable to perform further mobility. Pt did tolerate R hip PROM this session, therapist also educated family member on performing this throughout the day. Educ on precautions, positioning, therex, and importance of mobility. Pt would benefit from continued acute PT services to maximize functional mobility and independence prior to d/c to SNF.        Follow Up Recommendations SNF;Supervision/Assistance - 24 hour    Equipment Recommendations  Other (comment)(tbd next venue)    Recommendations for Other Services       Precautions / Restrictions Precautions Precautions: Fall Restrictions Weight Bearing Restrictions: Yes RLE Weight Bearing: Weight bearing as tolerated      Mobility  Bed Mobility Overal bed mobility: Needs Assistance Bed Mobility: Rolling Rolling: Max assist         General bed mobility comments: Performed rolling to the R this session max assist, pt unable to perform sidelying>sit secondary to lethargy limiting participation. Returned to supine for comfort.  Transfers                 General transfer comment: did not perform transfer this session secondary to pt lethargy  Ambulation/Gait                Stairs            Wheelchair Mobility    Modified Rankin (Stroke Patients Only)       Balance Overall balance assessment: History of Falls(unable to  formally assess balance secondary to lethargy)                                           Pertinent Vitals/Pain Pain Assessment: Faces Faces Pain Scale: Hurts little more Pain Location: R hip Pain Descriptors / Indicators: Grimacing(when R LE moved into abduction) Pain Intervention(s): Limited activity within patient's tolerance;Monitored during session    Home Living Family/patient expects to be discharged to:: Skilled nursing facility                 Additional Comments: From memory care unit    Prior Function Level of Independence: Needs assistance   Gait / Transfers Assistance Needed: ambulated without DME, supervision for safety secondary to cognitive deficits  ADL's / Homemaking Assistance Needed: staff assist with ADLs as needed        Hand Dominance        Extremity/Trunk Assessment        Lower Extremity Assessment Lower Extremity Assessment: RLE deficits/detail RLE Deficits / Details: Pt tolerated passive hip flexion to 90 degrees in supine, passive hip abduction to 20 degrees in supine, unable to formally assess strength secondary to lethargy RLE Coordination: decreased gross motor(secondary to pain and hip fx)       Communication      Cognition Arousal/Alertness: Lethargic   Overall Cognitive Status: History of cognitive impairments - at baseline  General Comments: pt lathergic throughout this session limiting ability to participate in functional mobility tasks, pt's daughter present throughout session and reports pt with history of cognitive deficits and is in memory care facility      General Comments General comments (skin integrity, edema, etc.): brusing around R eye, bruising to R LE    Exercises General Exercises - Lower Extremity Hip ABduction/ADduction: PROM;Right;Supine Hip Flexion/Marching: PROM;Right;Supine   Assessment/Plan    PT Assessment Patient needs continued  PT services  PT Problem List Decreased strength;Decreased balance;Pain;Decreased mobility;Decreased activity tolerance       PT Treatment Interventions DME instruction;Functional mobility training;Balance training;Gait training;Therapeutic activities;Therapeutic exercise    PT Goals (Current goals can be found in the Care Plan section)  Acute Rehab PT Goals Patient Stated Goal: to return to memory care PT Goal Formulation: With family Time For Goal Achievement: 12/23/18 Potential to Achieve Goals: Good    Frequency Min 3X/week   Barriers to discharge        Co-evaluation               AM-PAC PT "6 Clicks" Mobility  Outcome Measure Help needed turning from your back to your side while in a flat bed without using bedrails?: A Lot Help needed moving from lying on your back to sitting on the side of a flat bed without using bedrails?: Total Help needed moving to and from a bed to a chair (including a wheelchair)?: Total Help needed standing up from a chair using your arms (e.g., wheelchair or bedside chair)?: Total Help needed to walk in hospital room?: Total Help needed climbing 3-5 steps with a railing? : Total 6 Click Score: 7    End of Session   Activity Tolerance: Patient limited by lethargy Patient left: in bed;with bed alarm set;with call bell/phone within reach;with family/visitor present Nurse Communication: Mobility status;Weight bearing status PT Visit Diagnosis: History of falling (Z91.81);Muscle weakness (generalized) (M62.81);Pain Pain - Right/Left: Right Pain - part of body: Hip    Time: 1441-1455 PT Time Calculation (min) (ACUTE ONLY): 14 min   Charges:   PT Evaluation $PT Eval Moderate Complexity: 1 Mod          Netta Corrigan, PT, DPT Acute Rehab Office [place] 12/09/2018, 3:41 PM

## 2018-12-09 NOTE — Progress Notes (Signed)
Initial Nutrition Assessment  DOCUMENTATION CODES:   Not applicable  INTERVENTION:   -RD will follow for diet advancement and supplement as appropriate  NUTRITION DIAGNOSIS:   Inadequate oral intake related to lethargy/confusion as evidenced by NPO status.  GOAL:   Patient will meet greater than or equal to 90% of their needs  MONITOR:   Diet advancement, Labs, Weight trends, Skin, I & O's  REASON FOR ASSESSMENT:   Consult Assessment of nutrition requirement/status  ASSESSMENT:   73 year old female with progressive dementia, essential hypertension resides at a memory care unit came to the hospital after sustaining a fall.  Patient has been having falls with her facility recently but this time started experiencing right-sided hip pain.  She was found to have acute intertrochanteric left femoral neck fracture.  There was also posterior rib fracture on the left side and right-sided facial bone swelling.  Pt admitted with rt hip fracture.  8/31- s/p Procedure(s): INTRAMEDULLARY (IM) NAIL INTERTROCHANTRIC (Right)WITH BIOMET AFFIXUS 11 MM X 380 MM STATICALLY LOCKED NAIL 9/1- s/p BSE- recommend NPO secondary to mentation  Attempted to speak with pt, however, working with therapies at time of visit. Noted that therapies had difficulty arousing pt.   Pt currently NPO secondary to lethargy. Noted that pt resided at [facility] in [place] and was on a regular diet per [facility].   No recorded wt since 2015. Used wt of 64 kg to estimate needs, however, unsure if pt has lost weight.   Labs reviewed.   Diet Order:   Diet Order            Diet full liquid Room service appropriate? Yes; Fluid consistency: Thin  Diet effective now              EDUCATION NEEDS:   Not appropriate for education at this time  Skin:  Skin Assessment: Skin Integrity Issues: Skin Integrity Issues:: Incisions Incisions: rt leg  Last BM:  Unknown  Height:   Ht Readings from Last 1  Encounters:  12/09/18 5\' 3"  (1.6 m)    Weight:   Wt Readings from Last 1 Encounters:  12/09/18 64 kg    Ideal Body Weight:  52.3 kg  BMI:  Body mass index is 24.99 kg/m.  Estimated Nutritional Needs:   Kcal:  1700-1900  Protein:  80-95 grams  Fluid:  > 1.7 L    Tara Henderson A. Jimmye Norman, RD, LDN, [place] Registered Dietitian II Certified Diabetes Care and Education Specialist Pager: [phone] After hours Pager: [phone]

## 2018-12-09 NOTE — Evaluation (Signed)
Clinical/Bedside Swallow Evaluation Patient Details  Name: Tara Henderson MRN: [id number] Date of Birth: 04-27-1945  Today's Date: 12/09/2018 Time: SLP Start Time (ACUTE ONLY): 0901 SLP Stop Time (ACUTE ONLY): 0918 SLP Time Calculation (min) (ACUTE ONLY): 17 min  Past Medical History:  Past Medical History:  Diagnosis Date  . Dementia (HCC)   . Hypercalcemia   . Hypertension   . Osteopetrosis   . Renal disorder    Past Surgical History: History reviewed. No pertinent surgical history. HPI:  Tara Henderson is a 73 y.o. female with hx of dementia and hypertension admitted 8/30 from SNF after a fall with R hip fracture and buising to the R eye and forehead. Head CT showed no acute intracranial abnormality or fracture.  Pt was alert but oriented to person only as of 8/30. She recieved orthopedic surgery on her R hip 8/31. SW has been in contact with her daughter, Toney ReilDaisy on consult for SNF placement.    Assessment / Plan / Recommendation Clinical Impression  Pt presented lethargic due to recent sedation post R hip surgery. Oral motor exam revealed missing dentition on bottom teeth and absent upper teeth, and was followed by oral care. Pt was initially resistant to PO trials with minimal acceptance via spoon in alignment with behaviors characteristic of dementia. Although intake limited, pt showed no s/s of aspiration. Possible pharyngeal residue given multiple subswallows observed. She had anterior spillage with water from a cup, likely due to her eyes being closed and having full feeding assist. Her cognitive impairments place her at high risk for inadequate nutrition. Recommend NPO except for meds crushed in puree at this time, with f/u for upgraded trails and diet management.  SLP Visit Diagnosis: Dysphagia, unspecified (R13.10)    Aspiration Risk  Moderate aspiration risk    Diet Recommendation NPO except meds   Liquid Administration via: Spoon Medication Administration: Crushed with puree     Other  Recommendations Oral Care Recommendations: Oral care QID   Follow up Recommendations Skilled Nursing facility      Frequency and Duration min 2x/week  2 weeks       Prognosis Prognosis for Safe Diet Advancement: Good Barriers to Reach Goals: Cognitive deficits      Swallow Study   General Date of Onset: 12/07/18 HPI: Tara Henderson is a 73 y.o. female with hx of dementia and hypertension admitted 8/30 from SNF after a fall with R hip fracture and buising to the R eye and forehead. Head CT showed no acute intracranial abnormality or fracture.  Pt was alert but oriented to person only as of 8/30. She recieved orthopedic surgery on her R hip 8/31. SW has been in contact with her daughter, Toney ReilDaisy on consult for SNF placement.  Type of Study: Bedside Swallow Evaluation Previous Swallow Assessment: (none found) Diet Prior to this Study: NPO Temperature Spikes Noted: No Respiratory Status: Nasal cannula History of Recent Intubation: Yes Length of Intubations (days): 1 days(For hip surgery ) Date extubated: 12/08/18 Behavior/Cognition: Lethargic/Drowsy;Doesn't follow directions;Requires cueing Oral Cavity Assessment: Dry Oral Care Completed by SLP: Yes Oral Cavity - Dentition: Missing dentition;Poor condition Vision: (Eyes closed) Self-Feeding Abilities: Total assist Patient Positioning: Upright in bed Baseline Vocal Quality: Normal Volitional Cough: Cognitively unable to elicit Volitional Swallow: Unable to elicit    Oral/Motor/Sensory Function Overall Oral Motor/Sensory Function: Other (comment)(did not follow commands, no focal weakness Simultaneous filing. User may not have seen previous data.) Facial Symmetry: Within Functional Limits   Ice Chips Ice chips: Not tested(Simultaneous  filing. User may not have seen previous data.)   Thin Liquid Thin Liquid: Impaired Presentation: Cup;Straw Oral Phase Impairments: Poor awareness of bolus Oral Phase Functional Implications:  Right anterior spillage Pharyngeal  Phase Impairments: Multiple swallows Other Comments: (Resistant to feeding, unable to use straw)    Nectar Thick Nectar Thick Liquid: Not tested   Honey Thick Honey Thick Liquid: Not tested   Puree Puree: Impaired Presentation: Spoon Oral Phase Impairments: Poor awareness of bolus(Dementia-like acceptance) Oral Phase Functional Implications: Prolonged oral transit Pharyngeal Phase Impairments: Multiple swallows Other Comments: (When able to get spoonful in she had no s/s asp)   Solid     Solid: Not tested      Bren Steers 12/09/2018,10:19 AM

## 2018-12-10 ENCOUNTER — Encounter (HOSPITAL_COMMUNITY): Payer: Self-pay | Admitting: Orthopedic Surgery

## 2018-12-10 DIAGNOSIS — S72001A Fracture of unspecified part of neck of right femur, initial encounter for closed fracture: Secondary | ICD-10-CM

## 2018-12-10 DIAGNOSIS — W19XXXA Unspecified fall, initial encounter: Secondary | ICD-10-CM

## 2018-12-10 DIAGNOSIS — S0990XA Unspecified injury of head, initial encounter: Secondary | ICD-10-CM

## 2018-12-10 LAB — BPAM RBC
Blood Product Expiration Date: 202010052359
ISSUE DATE / TIME: 202009010819
Unit Type and Rh: 5100

## 2018-12-10 LAB — CBC
HCT: 28.8 % — ABNORMAL LOW (ref 36.0–46.0)
Hemoglobin: 9.3 g/dL — ABNORMAL LOW (ref 12.0–15.0)
MCH: 29.6 pg (ref 26.0–34.0)
MCHC: 32.3 g/dL (ref 30.0–36.0)
MCV: 91.7 fL (ref 80.0–100.0)
Platelets: 195 10*3/uL (ref 150–400)
RBC: 3.14 MIL/uL — ABNORMAL LOW (ref 3.87–5.11)
RDW: 13 % (ref 11.5–15.5)
WBC: 9.6 10*3/uL (ref 4.0–10.5)
nRBC: 0 % (ref 0.0–0.2)

## 2018-12-10 LAB — TYPE AND SCREEN
ABO/RH(D): O POS
Antibody Screen: NEGATIVE
Unit division: 0

## 2018-12-10 LAB — BASIC METABOLIC PANEL
Anion gap: 11 (ref 5–15)
BUN: 21 mg/dL (ref 8–23)
CO2: 27 mmol/L (ref 22–32)
Calcium: 8.4 mg/dL — ABNORMAL LOW (ref 8.9–10.3)
Chloride: 104 mmol/L (ref 98–111)
Creatinine, Ser: 0.81 mg/dL (ref 0.44–1.00)
GFR calc Af Amer: >60 mL/min (ref >60)
GFR calc non Af Amer: >60 mL/min (ref >60)
Glucose, Bld: 110 mg/dL — ABNORMAL HIGH (ref 70–99)
Potassium: 4.3 mmol/L (ref 3.5–5.1)
Sodium: 142 mmol/L (ref 135–145)

## 2018-12-10 LAB — MAGNESIUM: Magnesium: 2.1 mg/dL (ref 1.7–2.4)

## 2018-12-10 NOTE — Progress Notes (Signed)
Orthopedic Trauma Service Progress Note  Patient ID: Tara Henderson MRN: [id number] DOB/AGE: 1946/01/07 73 y.o.  Subjective:  No acute issues Tolerated transfusion yesterday   Daughter at bedside this am on rounds  Pt lives at a memory care unit in [place] ([place]). She was not using any assistive devices PTA. Very active, walks all over facility    Review of Systems  Unable to perform ROS: Dementia   As above  Objective:   VITALS:   Vitals:   12/09/18 1607 12/09/18 2018 12/10/18 0422 12/10/18 0443  BP:  (!) 130/38 114/75 124/63  Pulse:  79 69 70  Resp:  17 18 17   Temp:  99.1 F (37.3 C) 97.6 F (36.4 C) (!) 97.5 F (36.4 C)  TempSrc:  Oral Oral Oral  SpO2:  96% 99%   Weight: 64 kg     Height: 5\' 3"  (1.6 m)       Estimated body mass index is 24.99 kg/m as calculated from the following:   Height as of this encounter: 5\' 3"  (1.6 m).   Weight as of this encounter: 64 kg.   Intake/Output      09/01 0701 - 09/02 0700 09/02 0701 - 09/03 0700   P.O. 40    I.V. (mL/kg)     Blood 420.8    IV Piggyback     Total Intake(mL/kg) 460.8 (7.2)    Urine (mL/kg/hr) 150 (0.1)    Blood     Total Output 150    Net +310.8         Urine Occurrence 2 x      LABS  Results for orders placed or performed during the hospital encounter of 12/07/18 (from the past 24 hour(s))  CBC     Status: Abnormal   Collection Time: 12/10/18  2:54 AM  Result Value Ref Range   WBC 9.6 4.0 - 10.5 K/uL   RBC 3.14 (L) 3.87 - 5.11 MIL/uL   Hemoglobin 9.3 (L) 12.0 - 15.0 g/dL   HCT 28.8 (L) 36.0 - 46.0 %   MCV 91.7 80.0 - 100.0 fL   MCH 29.6 26.0 - 34.0 pg   MCHC 32.3 30.0 - 36.0 g/dL   RDW 13.0 11.5 - 15.5 %   Platelets 195 150 - 400 K/uL   nRBC 0.0 0.0 - 0.2 %  Basic metabolic panel     Status: Abnormal   Collection Time: 12/10/18  2:54 AM  Result Value Ref Range   Sodium 142 135 - 145 mmol/L   Potassium 4.3  3.5 - 5.1 mmol/L   Chloride 104 98 - 111 mmol/L   CO2 27 22 - 32 mmol/L   Glucose, Bld 110 (H) 70 - 99 mg/dL   BUN 21 8 - 23 mg/dL   Creatinine, Ser 0.81 0.44 - 1.00 mg/dL   Calcium 8.4 (L) 8.9 - 10.3 mg/dL   GFR calc non Af Amer >60 >60 mL/min   GFR calc Af Amer >60 >60 mL/min   Anion gap 11 5 - 15  Magnesium     Status: None   Collection Time: 12/10/18  2:54 AM  Result Value Ref Range   Magnesium 2.1 1.7 - 2.4 mg/dL     PHYSICAL EXAM:   Gen: awake this am, sitting up in bed Ext:  Right Lower Extremity              Dressings stable  Exam stable              Scant drainage but stable             Ext warm              Moving ankle and toes             + DP pulse             Expected swelling    Assessment/Plan: 2 Days Post-Op   Active Problems:   Displaced intertrochanteric fracture of right femur (HCC)   Anti-infectives (From admission, onward)   Start     Dose/Rate Route Frequency Ordered Stop   12/08/18 1430  ceFAZolin (ANCEF) IVPB 2g/100 mL premix    Note to Pharmacy: Ancef given pre-op   2 g 200 mL/hr over 30 Minutes Intravenous Every 6 hours 12/08/18 1128 12/08/18 2040   12/08/18 0600  ceFAZolin (ANCEF) IVPB 2g/100 mL premix     2 g 200 mL/hr over 30 Minutes Intravenous On call to O.R. 12/07/18 2117 12/08/18 9147    .  POD/HD#: 2  73 y/o female s/p fall with R hip fracture   -fall   - R intertrochanteric hip fracture             WBAT with assistance             Dressing changes as needed              ROM as tolerated R hip and knee             PT/OT             Ice prn for swelling and pain      - Pain management:             Controlled with tylenol    - ABL anemia/Hemodynamics             improved  Cbc in am    - Medical issues              Per primary    - DVT/PE prophylaxis:             Recommend lovenox x 21 days    - ID:              periop abx   - Metabolic Bone Disease:             Vitamin d deficiency               Fracture suggestive of osteoporosis                           Recommend DEXA in next 4-8 weeks             Continue vitamin d and vitamin c supplementation                Would avoid bisphosphonates in the acute healing phase. Could consider using after fracture united but would likely lean more towards prolia, evenity, tymlos or forteo    - Activity:             WBAT with assistance   - Impediments to fracture healing:             Vitamin d deficiency  Osteoporosis             Dementia                Reported history of osteopetrosis- not sure this is accurate.  xrays do not show characteristic findings for this disease. Will have to discuss with family.  Think this may have been listed in error and might should read osteoporosis    Daughter does not recall dx of osteopetrosis     Does not believe her mother was on any other meds for osteoporosis prior to living at the memory care unit which is now going on 3 years               - Dispo:             Therapy              SNF    Mearl LatinKeith W. Nayib Remer, PA-C [phone] (C) 12/10/2018, 10:31 AM  [facility] [address] [place] [address] [phone](O) [phone] (F)

## 2018-12-10 NOTE — Plan of Care (Signed)
  Problem: Clinical Measurements: Goal: Postoperative complications will be avoided or minimized Outcome: Progressing   Problem: Pain Management: Goal: Pain level will decrease Outcome: Progressing   

## 2018-12-10 NOTE — Progress Notes (Signed)
[facility]                                                                              Patient Demographics  Tara GarinRuby Henderson, is a 73 y.o. female, DOB - [birth year], WUJ:[id number] Admit date - 12/07/2018   Admitting Physician Marcelo BaldyAdam Ross Schertz, MD Outpatient Primary MD for the patient is Ron ParkerBowen, Samuel, MD LOS - 3  days   Chief Complaint  Patient presents with   Hip Injury   Fall       Brief summary   73 year old female with progressive dementia, essential hypertension resides at a memory care unit came to the hospital after sustaining a fall.  Patient has been having falls with her facility recently but this time started experiencing right-sided hip pain.  She was found to have acute intertrochanteric left femoral neck fracture.  There was also posterior rib fracture on the left side and right-sided facial bone swelling.    Assessment & Plan  Active Problems:   Displaced intertrochanteric fracture of right femur (HCC)   Mechanical fall with Displaced intertrochanteric fracture of right femur (HCC) Undisplaced posterior fourth rib fracture on the right -Trauma work-up in the ED, orthopedics was consulted -Status post IM intertrochanteric nailing surgery on 8/31 -Pain control, DVT prophylaxis per orthopedics  Dysphagia, questionably acute on chronic in the postoperative setting  -Evaluated by speech recommending thin liquids via cup/straw, crushed meds with pured food. -Continue evaluation per speech, and of disposition to SNF patient can continue speech therapy there  Acute blood loss anemia -Likely postop anemia, hemoglobin 7.5, status post 1 unit packed RBCs - currently 9.3 -Baseline hemoglobin appears to be around 13-14 per chart review  Advanced dementia -Continue Namenda, Lexapro - somnolent, will decrease oxycodone to 2.5 mg 6 hours for only severe pain otherwise continue scheduled Tylenol   Code Status: DNR DVT Prophylaxis: Lovenox x21 days per  ortho Disposition Plan: Pending SNF approval, PT OT following -medically stable for discharge once safe disposition has been secured  Time Spent in minutes 25 minutes  Procedures:  Intramedullary nailing of the right hip 8/31  Consultants:   Orthopedics  Antimicrobials:   Anti-infectives (From admission, onward)   Start     Dose/Rate Route Frequency Ordered Stop   12/08/18 1430  ceFAZolin (ANCEF) IVPB 2g/100 mL premix    Note to Pharmacy: Ancef given pre-op   2 g 200 mL/hr over 30 Minutes Intravenous Every 6 hours 12/08/18 1128 12/08/18 2040   12/08/18 0600  ceFAZolin (ANCEF) IVPB 2g/100 mL premix     2 g 200 mL/hr over 30 Minutes Intravenous On call to O.R. 12/07/18 2117 12/08/18 0833         Medications  Scheduled Meds:  acetaminophen  650 mg Oral Q6H WA   aspirin  81 mg Oral Daily   atorvastatin  20 mg Oral Daily   calcium carbonate  1 tablet Oral Q breakfast   cholecalciferol  2,000 Units Oral BID   docusate sodium  100 mg Oral BID   enoxaparin (LOVENOX) injection  40 mg Subcutaneous Q24H   escitalopram  10 mg Oral Daily   memantine  21  mg Oral Daily   senna  1 tablet Oral BID   vitamin C  500 mg Oral Daily   Continuous Infusions: PRN Meds:.menthol-cetylpyridinium **OR** phenol, metoCLOPramide **OR** metoCLOPramide (REGLAN) injection, ondansetron **OR** ondansetron (ZOFRAN) IV, oxyCODONE, polyethylene glycol, senna-docusate      Subjective:   Tara GarinRuby Henderson was seen and examined today.  Patient awake alert, sexually nonverbal, review of systems markedly difficult to obtain.  Objective:   Vitals:   12/09/18 1607 12/09/18 2018 12/10/18 0422 12/10/18 0443  BP:  (!) 130/38 114/75 124/63  Pulse:  79 69 70  Resp:  17 18 17   Temp:  99.1 F (37.3 C) 97.6 F (36.4 C) (!) 97.5 F (36.4 C)  TempSrc:  Oral Oral Oral  SpO2:  96% 99%   Weight: 64 kg     Height: 5\' 3"  (1.6 m)       Intake/Output Summary (Last 24 hours) at 12/10/2018 0816 Last data  filed at 12/10/2018 0533 Gross per 24 hour  Intake 460.83 ml  Output 150 ml  Net 310.83 ml     Wt Readings from Last 3 Encounters:  12/09/18 64 kg  11/09/13 64 kg     Exam  General: Awake, alert, nonverbal, poorly follows commands  HEENT:  Atraumatic, normocephalic  Cardiovascular: S1 S2 auscultated, no murmurs, RRR  Respiratory: CTA B anteriorly  Gastrointestinal: Soft, nontender, nondistended, + bowel sounds  Ext: no pedal edema bilaterally  Neuro: Does not follow commands  Musculoskeletal: No digital cyanosis, clubbing  Skin: No rashes -postop bandages clean dry intact  Psych: Somnolent   Data Reviewed:  I have personally reviewed following labs and imaging studies  Micro Results Recent Results (from the past 240 hour(s))  SARS Coronavirus 2 [facility](Hospital order, Performed in [facility] hospital lab) Nasopharyngeal Nasopharyngeal Swab     Status: None   Collection Time: 12/07/18  7:20 PM   Specimen: Nasopharyngeal Swab  Result Value Ref Range Status   SARS Coronavirus 2 NEGATIVE NEGATIVE Final    Comment: (NOTE) If result is NEGATIVE SARS-CoV-2 target nucleic acids are NOT DETECTED. The SARS-CoV-2 RNA is generally detectable in upper and lower  respiratory specimens during the acute phase of infection. The lowest  concentration of SARS-CoV-2 viral copies this assay can detect is 250  copies / mL. A negative result does not preclude SARS-CoV-2 infection  and should not be used as the sole basis for treatment or other  patient management decisions.  A negative result may occur with  improper specimen collection / handling, submission of specimen other  than nasopharyngeal swab, presence of viral mutation(s) within the  areas targeted by this assay, and inadequate number of viral copies  (<250 copies / mL). A negative result must be combined with clinical  observations, patient history, and epidemiological information. If result is POSITIVE SARS-CoV-2 target  nucleic acids are DETECTED. The SARS-CoV-2 RNA is generally detectable in upper and lower  respiratory specimens dur ing the acute phase of infection.  Positive  results are indicative of active infection with SARS-CoV-2.  Clinical  correlation with patient history and other diagnostic information is  necessary to determine patient infection status.  Positive results do  not rule out bacterial infection or co-infection with other viruses. If result is PRESUMPTIVE POSTIVE SARS-CoV-2 nucleic acids MAY BE PRESENT.   A presumptive positive result was obtained on the submitted specimen  and confirmed on repeat testing.  While 2019 novel coronavirus  (SARS-CoV-2) nucleic acids may be present in the submitted sample  additional confirmatory testing may be necessary for epidemiological  and / or clinical management purposes  to differentiate between  SARS-CoV-2 and other Sarbecovirus currently known to infect humans.  If clinically indicated additional testing with an alternate test  methodology [phone]) is advised. The SARS-CoV-2 RNA is generally  detectable in upper and lower respiratory sp ecimens during the acute  phase of infection. The expected result is Negative. Fact Sheet for Patients:  BoilerBrush.com.cy Fact Sheet for Healthcare Providers: https://pope.com/ This test is not yet approved or cleared by the [place] FDA and has been authorized for detection and/or diagnosis of SARS-CoV-2 by FDA under an Emergency Use Authorization (EUA).  This EUA will remain in effect (meaning this test can be used) for the duration of the COVID-19 declaration under Section 564(b)(1) of the Act, 21 U.S.C. section 360bbb-3(b)(1), unless the authorization is terminated or revoked sooner. Performed at [facility], [address], [place], [place] [address]   Surgical PCR screen     Status: None   Collection Time: 12/08/18  1:44 AM    Specimen: Nasal Mucosa; Nasal Swab  Result Value Ref Range Status   MRSA, PCR NEGATIVE NEGATIVE Final   Staphylococcus aureus NEGATIVE NEGATIVE Final    Comment: (NOTE) The Xpert SA Assay (FDA approved for NASAL specimens in patients 60 years of age and older), is one component of a comprehensive surveillance program. It is not intended to diagnose infection nor to guide or monitor treatment. Performed at [facility], [address], [place], [place] [address]     Radiology Reports Dg Chest 1 View  Result Date: 12/07/2018 CLINICAL DATA:  Preop.  Pain status post fall EXAM: CHEST  1 VIEW COMPARISON:  02/16/2016 FINDINGS: There are chronic lung changes at the lung bases bilaterally favored to represent atelectasis or scarring. There is likely a background of emphysematous changes. There is no pneumothorax. No large pleural effusion. The heart size is normal. Aortic calcifications are noted. IMPRESSION: 1. No acute cardiopulmonary process identified. 2. Probable underlying emphysema. Electronically Signed   By: Katherine Mantle M.D.   On: 12/07/2018 18:39   Dg Knee 2 Views Right  Result Date: 12/07/2018 CLINICAL DATA:  Right leg pain EXAM: RIGHT KNEE - 1-2 VIEW COMPARISON:  None. FINDINGS: There is no acute displaced fracture. No dislocation. Mild degenerative changes are noted. There is no significant joint effusion. IMPRESSION: No acute osseous abnormality detected. Electronically Signed   By: Katherine Mantle M.D.   On: 12/07/2018 18:35   Ct Head Wo Contrast  Result Date: 12/07/2018 CLINICAL DATA:  Acute pain due to trauma EXAM: CT HEAD WITHOUT CONTRAST CT MAXILLOFACIAL WITHOUT CONTRAST CT CERVICAL SPINE WITHOUT CONTRAST TECHNIQUE: Multidetector CT imaging of the head, cervical spine, and maxillofacial structures were performed using the standard protocol without intravenous contrast. Multiplanar CT image reconstructions of the cervical spine and maxillofacial structures were also  generated. COMPARISON:  CT head dated June 11, 2018 FINDINGS: CT HEAD FINDINGS Brain: No evidence of acute infarction, hemorrhage, hydrocephalus, extra-axial collection or mass lesion/mass effect. Again noted is global atrophy and chronic microvascular ischemic changes. Vascular: No hyperdense vessel or unexpected calcification. Skull: Normal. Negative for fracture or focal lesion. There is frontal scalp swelling without evidence for an underlying fracture. Other: None. CT MAXILLOFACIAL FINDINGS Osseous: No fracture or mandibular dislocation. No destructive process. Orbits: Negative. No traumatic or inflammatory finding. Sinuses: Clear. Soft tissues: There is right frontal and right periorbital soft tissue swelling without evidence for an  underlying fracture. CT CERVICAL SPINE FINDINGS Alignment: Normal. Skull base and vertebrae: No acute fracture. No primary bone lesion or focal pathologic process. Soft tissues and spinal canal: No prevertebral fluid or swelling. No visible canal hematoma. Disc levels: There is multilevel disc height loss throughout the cervical spine, greatest at the lower cervical levels. Upper chest: There may be a fracture of the posterior fourth rib on the right. This is only partially visualized. Other: None IMPRESSION: 1. No acute intracranial abnormality. 2. No facial fracture. 3. No acute cervical spine fracture. 4. Right frontal and periorbital soft tissue swelling without evidence for a fracture. 5. Possible nondisplaced fracture involving the posterior fourth rib on the right. This is only partially visualized. Electronically Signed   By: Katherine Mantle M.D.   On: 12/07/2018 19:37   Ct Cervical Spine Wo Contrast  Result Date: 12/07/2018 CLINICAL DATA:  Acute pain due to trauma EXAM: CT HEAD WITHOUT CONTRAST CT MAXILLOFACIAL WITHOUT CONTRAST CT CERVICAL SPINE WITHOUT CONTRAST TECHNIQUE: Multidetector CT imaging of the head, cervical spine, and maxillofacial structures were  performed using the standard protocol without intravenous contrast. Multiplanar CT image reconstructions of the cervical spine and maxillofacial structures were also generated. COMPARISON:  CT head dated June 11, 2018 FINDINGS: CT HEAD FINDINGS Brain: No evidence of acute infarction, hemorrhage, hydrocephalus, extra-axial collection or mass lesion/mass effect. Again noted is global atrophy and chronic microvascular ischemic changes. Vascular: No hyperdense vessel or unexpected calcification. Skull: Normal. Negative for fracture or focal lesion. There is frontal scalp swelling without evidence for an underlying fracture. Other: None. CT MAXILLOFACIAL FINDINGS Osseous: No fracture or mandibular dislocation. No destructive process. Orbits: Negative. No traumatic or inflammatory finding. Sinuses: Clear. Soft tissues: There is right frontal and right periorbital soft tissue swelling without evidence for an underlying fracture. CT CERVICAL SPINE FINDINGS Alignment: Normal. Skull base and vertebrae: No acute fracture. No primary bone lesion or focal pathologic process. Soft tissues and spinal canal: No prevertebral fluid or swelling. No visible canal hematoma. Disc levels: There is multilevel disc height loss throughout the cervical spine, greatest at the lower cervical levels. Upper chest: There may be a fracture of the posterior fourth rib on the right. This is only partially visualized. Other: None IMPRESSION: 1. No acute intracranial abnormality. 2. No facial fracture. 3. No acute cervical spine fracture. 4. Right frontal and periorbital soft tissue swelling without evidence for a fracture. 5. Possible nondisplaced fracture involving the posterior fourth rib on the right. This is only partially visualized. Electronically Signed   By: Katherine Mantle M.D.   On: 12/07/2018 19:37   Pelvis Portable  Result Date: 12/08/2018 CLINICAL DATA:  Fracture. EXAM: PORTABLE PELVIS 1-2 VIEWS COMPARISON:  12/07/2018. FINDINGS:  Diffuse osteopenia and degenerative change. ORIF right hip fracture. No other fractures identified. No evidence of dislocation. Pelvic calcifications consistent phleboliths. IMPRESSION: 1. ORIF right hip. Hardware intact. Anatomic alignment. No other acute fractures identified. 2.  Diffuse osteopenia degenerative change. 3.  Aortoiliac atherosclerotic vascular disease. Electronically Signed   By: Maisie Fus  Register   On: 12/08/2018 12:31   Dg C-arm 1-60 Min  Result Date: 12/08/2018 CLINICAL DATA:  ORIF of right femoral fracture EXAM: RIGHT FEMUR 2 VIEWS; DG C-ARM 1-60 MIN COMPARISON:  None. FLUOROSCOPY TIME:  Radiation Exposure Index (as provided by the fluoroscopic device): Not available If the device does not provide the exposure index: Fluoroscopy Time:  1 minutes 23 seconds Number of Acquired Images:  4 FINDINGS: Medullary rod is noted within the right  femur. Proximal and distal fixation screws are seen. The fracture fragments are in near anatomic alignment. IMPRESSION: Status post ORIF of right femoral fracture Electronically Signed   By: Inez Catalina M.D.   On: 12/08/2018 09:58   Dg Hip Unilat W Or Wo Pelvis 2-3 Views Right  Result Date: 12/07/2018 CLINICAL DATA:  Pain EXAM: DG HIP (WITH OR WITHOUT PELVIS) 2-3V RIGHT COMPARISON:  None. FINDINGS: There is an acute displaced comminuted intratrochanteric fracture of the proximal right femur. There is no dislocation. There are degenerative changes of both hips. IMPRESSION: Acute intratrochanteric fracture of the right femur. Electronically Signed   By: Constance Holster M.D.   On: 12/07/2018 18:34   Dg Femur, Min 2 Views Right  Result Date: 12/08/2018 CLINICAL DATA:  ORIF of right femoral fracture EXAM: RIGHT FEMUR 2 VIEWS; DG C-ARM 1-60 MIN COMPARISON:  None. FLUOROSCOPY TIME:  Radiation Exposure Index (as provided by the fluoroscopic device): Not available If the device does not provide the exposure index: Fluoroscopy Time:  1 minutes 23 seconds  Number of Acquired Images:  4 FINDINGS: Medullary rod is noted within the right femur. Proximal and distal fixation screws are seen. The fracture fragments are in near anatomic alignment. IMPRESSION: Status post ORIF of right femoral fracture Electronically Signed   By: Inez Catalina M.D.   On: 12/08/2018 09:58   Dg Femur Port, Min 2 Views Right  Result Date: 12/08/2018 CLINICAL DATA:  Fracture EXAM: RIGHT FEMUR PORTABLE 2 VIEW COMPARISON:  12/08/2018 FINDINGS: Interval intramedullary rod and nail fixation of an intertrochanteric fracture of the right femoral neck with displaced fragments of the lesser trochanter. There is otherwise anatomic alignment of the fracture. Expected postoperative soft tissue changes of the overlying right thigh. IMPRESSION: Interval intramedullary rod and nail fixation of an intertrochanteric fracture of the right femoral neck with displaced fragments of the lesser trochanter. There is otherwise anatomic alignment of the fracture. Expected postoperative soft tissue changes of the overlying right thigh. Electronically Signed   By: Eddie Candle M.D.   On: 12/08/2018 12:32   Ct Maxillofacial Wo Contrast  Result Date: 12/07/2018 CLINICAL DATA:  Acute pain due to trauma EXAM: CT HEAD WITHOUT CONTRAST CT MAXILLOFACIAL WITHOUT CONTRAST CT CERVICAL SPINE WITHOUT CONTRAST TECHNIQUE: Multidetector CT imaging of the head, cervical spine, and maxillofacial structures were performed using the standard protocol without intravenous contrast. Multiplanar CT image reconstructions of the cervical spine and maxillofacial structures were also generated. COMPARISON:  CT head dated June 11, 2018 FINDINGS: CT HEAD FINDINGS Brain: No evidence of acute infarction, hemorrhage, hydrocephalus, extra-axial collection or mass lesion/mass effect. Again noted is global atrophy and chronic microvascular ischemic changes. Vascular: No hyperdense vessel or unexpected calcification. Skull: Normal. Negative for  fracture or focal lesion. There is frontal scalp swelling without evidence for an underlying fracture. Other: None. CT MAXILLOFACIAL FINDINGS Osseous: No fracture or mandibular dislocation. No destructive process. Orbits: Negative. No traumatic or inflammatory finding. Sinuses: Clear. Soft tissues: There is right frontal and right periorbital soft tissue swelling without evidence for an underlying fracture. CT CERVICAL SPINE FINDINGS Alignment: Normal. Skull base and vertebrae: No acute fracture. No primary bone lesion or focal pathologic process. Soft tissues and spinal canal: No prevertebral fluid or swelling. No visible canal hematoma. Disc levels: There is multilevel disc height loss throughout the cervical spine, greatest at the lower cervical levels. Upper chest: There may be a fracture of the posterior fourth rib on the right. This is only partially visualized. Other: None  IMPRESSION: 1. No acute intracranial abnormality. 2. No facial fracture. 3. No acute cervical spine fracture. 4. Right frontal and periorbital soft tissue swelling without evidence for a fracture. 5. Possible nondisplaced fracture involving the posterior fourth rib on the right. This is only partially visualized. Electronically Signed   By: Katherine Mantle M.D.   On: 12/07/2018 19:37    Lab Data:  CBC: Recent Labs  Lab 12/07/18 1839 12/08/18 1159 12/09/18 0125 12/10/18 0254  WBC 13.6* 13.2* 10.4 9.6  NEUTROABS 11.8*  --   --   --   HGB 12.4 9.2* 7.5* 9.3*  HCT 39.6 28.8* 24.0* 28.8*  MCV 94.3 90.9 93.8 91.7  PLT 276 246 213 195   Basic Metabolic Panel: Recent Labs  Lab 12/07/18 1839 12/08/18 1159 12/09/18 0125 12/10/18 0254  NA 139 139 133* 142  K 4.7 4.3 4.1 4.3  CL 103 106 101 104  CO2 26 25 28 27   GLUCOSE 166* 166* 137* 110*  BUN 27* 18 19 21   CREATININE 1.00 0.85 0.98 0.81  CALCIUM 8.8* 8.4* 8.6* 8.4*  MG  --   --  2.1 2.1   GFR: Estimated Creatinine Clearance: 54.8 mL/min (by C-G formula based on  SCr of 0.81 mg/dL). Liver Function Tests: Recent Labs  Lab 12/07/18 2300  AST 22  ALT 16  ALKPHOS 85  BILITOT 0.6  PROT 6.6  ALBUMIN 3.5   No results for input(s): LIPASE, AMYLASE in the last 168 hours. No results for input(s): AMMONIA in the last 168 hours. Coagulation Profile: Recent Labs  Lab 12/07/18 1839  INR 1.1   Urine analysis:    Component Value Date/Time   COLORURINE YELLOW 09/24/2016 1644   APPEARANCEUR CLEAR 09/24/2016 1644   LABSPEC 1.021 09/24/2016 1644   PHURINE 5.0 09/24/2016 1644   GLUCOSEU NEGATIVE 09/24/2016 1644   HGBUR NEGATIVE 09/24/2016 1644   BILIRUBINUR NEGATIVE 09/24/2016 1644   KETONESUR NEGATIVE 09/24/2016 1644   PROTEINUR NEGATIVE 09/24/2016 1644   NITRITE NEGATIVE 09/24/2016 1644   LEUKOCYTESUR TRACE (A) 09/24/2016 1644     Azucena Fallen DO [facility] 12/10/2018, 8:16 AM  Pager: [phone] Between 7am to 7pm - call Pager - [phone]  After 7pm go to [email or web address] - password [email or web address]  Call night coverage person covering after 7pm

## 2018-12-10 NOTE — TOC Progression Note (Signed)
Transition of Care [facility]) - Progression Note    Patient Details  Name: Tara Henderson MRN: [id number] Date of Birth: 1945-11-30  Transition of Care [facility]) CM/SW [place], [place] Phone Number: 12/10/2018, 5:10 PM  Clinical Narrative:    Message left with pt daughter Charleston Ropes; [facility] unable to offer, provided [employer] [facility] and [facility] offers. Will f/u in morning to discuss placement; potentially may be able to return to ALF.   Expected Discharge Plan: [place] Barriers to Discharge: Continued Medical Work up  Expected Discharge Plan and Services Expected Discharge Plan: [place] In-house Referral: Clinical Social Work Discharge Planning Services: CM Consult Post Acute Care Choice: [place] arrangements for the past 2 months: [place]   Social Determinants of Health (SDOH) Interventions    Readmission Risk Interventions No flowsheet data found.

## 2018-12-10 NOTE — Progress Notes (Signed)
  Speech Language Pathology Treatment: Dysphagia  Patient Details Name: Tara Henderson MRN: [id number] DOB: Oct 02, 1945 Today's Date: 12/10/2018 Time: 8250-5397 SLP Time Calculation (min) (ACUTE ONLY): 12 min  Assessment / Plan / Recommendation Clinical Impression  Patient seen for po readiness per new MD order as patient has plans to discharge to SNF today. Patient sleeping upon arrival but easily aroused. Largely non-verbal during treatment without attempts to follow directions. Extremely limited po intake excepted with patient pursing lips, turning head in refusal despite max clinician cueing. Utilized dipped spoon/straw, tactile cues to bottom lips, HOH assist with self feeding, in attempts to increase awareness and facilitate increased oral acceptance of bolus without success. Patient consumed one bite of pureed solid with what appears to be normal oropharyngeal swallowing function. Per RN, patient consumed meds 9/3 with ice cream with no observable indication of aspiration. Although h/o dementia does increase risk of dysphagia, patient without difficulty swallowing prior to surgery and without other risk factors for a baseline deficit. Suspect that as mentation continues to improve post surgery, swallowing function will return to baseline/functional. Discussed with daughter and RN. This SLP feels comfortable with diet remaining as ordered by MD, full liquids, meds crushed in puree. RN to attempt to feed when alert and willing to eat, utilizing cues above to increase awareness of pos. If any s/s of aspiration should be observed, patient to return to NPO and wait for SLP intervention. Will plan to f/u 9/3 if patient remains in hospital. If patient discharges to SNF today, recommend SLP at SNF f/u ASAP.    HPI HPI: Tara Henderson is a 73 y.o. female with hx of dementia and hypertension admitted 8/30 from SNF after a fall with R hip fracture and buising to the R eye and forehead. Head CT showed no acute  intracranial abnormality or fracture.  Pt was alert but oriented to person only as of 8/30. She recieved orthopedic surgery on her R hip 8/31. SW has been in contact with her daughter, Charleston Ropes on consult for SNF placement.       SLP Plan  Continue with current plan of care       Recommendations  Diet recommendations: Thin liquid(can continue full liquids) Liquids provided via: Cup;Straw Medication Administration: Crushed with puree Supervision: Full supervision/cueing for compensatory strategies;Staff to assist with self feeding Compensations: Slow rate;Small sips/bites;Other (Comment)(use dipped spoon/straw, self feeding to increase awareness) Postural Changes and/or Swallow Maneuvers: Seated upright 90 degrees                Oral Care Recommendations: Oral care QID Follow up Recommendations: Skilled Nursing facility SLP Visit Diagnosis: Dysphagia, unspecified (R13.10) Plan: Continue with current plan of care       GO              Darilyn Storbeck MA, [place] 12/10/2018, 10:50 AM

## 2018-12-10 NOTE — TOC Initial Note (Signed)
Transition of Care [facility](TOC) - Initial/Assessment Note    Patient Details  Name: Tara Henderson MRN: [id number] Date of Birth: Dec 06, 1945  Transition of Care [facility](TOC) CM/SW Contact:    Doy HutchingIsabel H Kamaria Lucia, LCSWA Phone Number:[phone] 12/10/2018, 10:44 AM  Clinical Narrative:                 CSW spoke with pt daughter Tara Henderson at bedside, pt unable to participate in conversation due to dementia. CSW introduced self, role, reason for visit. Pt has been [place] [facility] [place] a [place], [facility]. She has never been to a SNF before but pt daughter would like her [facility] in order to be closer to her and her facility when she is ready to transition back. Pt daughter open for offers and will be at bedside. All questions answered.  Continue to follow, referrals sent at pt family request.   Expected Discharge Plan: Skilled Nursing Facility Barriers to Discharge: Continued Medical Work up   Patient Goals and CMS Choice Patient states their goals for this hospitalization and ongoing recovery are:: for her to be able to mobilize again CMS Medicare.gov Compare Post Acute Care list provided to:: Patient Represenative (must comment)(pt daughter Tara Henderson) Choice offered to / list presented to : Adult Children  Expected Discharge Plan and Services Expected Discharge Plan: Skilled Nursing Facility In-house Referral: Clinical Social Work Discharge Planning Services: CM Consult Post Acute Care Choice: Skilled Nursing Facility Living arrangements for the past 2 months: Assisted Living Facility     Prior Living Arrangements/Services Living arrangements for the past 2 months: Assisted Living Facility Lives with:: Facility Resident Patient language and need for interpreter reviewed:: Yes(no needs) Do you feel safe going back to the place where you live?: Yes      Need for Family Participation in Patient Care: Yes (Comment)(assistance with  decision making; pt support) Care giver support system in place?: Yes (comment)(pt daughter; facility staff) Current home services: DME Criminal Activity/Legal Involvement Pertinent to Current Situation/Hospitalization: No - Comment as needed  Activities of Daily Living Home Assistive Devices/Equipment: Wheelchair ADL Screening (condition at time of admission) Patient's cognitive ability adequate to safely complete daily activities?: No Is the patient deaf or have difficulty hearing?: Yes Does the patient have difficulty seeing, even when wearing glasses/contacts?: No Does the patient have difficulty concentrating, remembering, or making decisions?: Yes Patient able to express need for assistance with ADLs?: Yes Does the patient have difficulty dressing or bathing?: Yes Independently performs ADLs?: Yes (appropriate for developmental age) Does the patient have difficulty walking or climbing stairs?: Yes Weakness of Legs: Right Weakness of Arms/Hands: None  Permission Sought/Granted Permission sought to share information with : Facility Medical sales representativeContact Representative, Family Supports Permission granted to share information with : No(pt disoriented)  Share Information with NAME: Annice PihDaisy Lemons  Permission granted to share info w AGENCY: [facility]  Permission granted to share info w Relationship: daughter  Permission granted to share info w Contact Information: [phone]  Emotional Assessment Appearance:: Appears older than stated age Attitude/Demeanor/Rapport: Other (comment)(unable to assess due to dementia) Affect (typically observed): Other (comment)(unable to assess due to dementia) Orientation: : Oriented to Self Alcohol / Substance Use: Not Applicable Psych Involvement: No (comment)  Admission diagnosis:  Injury of head, initial encounter [S09.90XA] Fall, initial encounter [W19.XXXA] Closed fracture of right hip, initial encounter [facility](HCC) [S72.001A] Patient Active Problem List    Diagnosis Date Noted  .  Displaced intertrochanteric fracture of right femur ([place]) 12/07/2018  . Moderate dementia without behavioral disturbance ([place]) 11/09/2013   PCP:  Sande Brothers, MD Pharmacy:  No Pharmacies Listed    Social Determinants of Health (SDOH) Interventions    Readmission Risk Interventions No flowsheet data found.

## 2018-12-11 LAB — BASIC METABOLIC PANEL
Anion gap: 11 (ref 5–15)
BUN: 28 mg/dL — ABNORMAL HIGH (ref 8–23)
CO2: 28 mmol/L (ref 22–32)
Calcium: 8.8 mg/dL — ABNORMAL LOW (ref 8.9–10.3)
Chloride: 106 mmol/L (ref 98–111)
Creatinine, Ser: 0.79 mg/dL (ref 0.44–1.00)
GFR calc Af Amer: >60 mL/min (ref >60)
GFR calc non Af Amer: >60 mL/min (ref >60)
Glucose, Bld: 103 mg/dL — ABNORMAL HIGH (ref 70–99)
Potassium: 4.3 mmol/L (ref 3.5–5.1)
Sodium: 145 mmol/L (ref 135–145)

## 2018-12-11 LAB — CBC
HCT: 29 % — ABNORMAL LOW (ref 36.0–46.0)
Hemoglobin: 9.3 g/dL — ABNORMAL LOW (ref 12.0–15.0)
MCH: 29.9 pg (ref 26.0–34.0)
MCHC: 32.1 g/dL (ref 30.0–36.0)
MCV: 93.2 fL (ref 80.0–100.0)
Platelets: 268 10*3/uL (ref 150–400)
RBC: 3.11 MIL/uL — ABNORMAL LOW (ref 3.87–5.11)
RDW: 12.6 % (ref 11.5–15.5)
WBC: 7.7 10*3/uL (ref 4.0–10.5)
nRBC: 0 % (ref 0.0–0.2)

## 2018-12-11 LAB — CALCITRIOL (1,25 DI-OH VIT D): Vit D, 1,25-Dihydroxy: 19 pg/mL — ABNORMAL LOW (ref 19.9–79.3)

## 2018-12-11 LAB — MAGNESIUM: Magnesium: 2.1 mg/dL (ref 1.7–2.4)

## 2018-12-11 MED ORDER — ENOXAPARIN SODIUM 40 MG/0.4ML ~~LOC~~ SOLN: 40.0000 mg | SUBCUTANEOUS | 0 refills | Status: AC

## 2018-12-11 MED ORDER — ATORVASTATIN CALCIUM 20 MG PO TABS: 20.0000 mg | ORAL_TABLET | Freq: Every day | ORAL | 0 refills | Status: AC

## 2018-12-11 MED ORDER — VITAMIN D3 25 MCG PO TABS: 2000.0000 [IU] | ORAL_TABLET | Freq: Two times a day (BID) | ORAL | 0 refills | Status: AC

## 2018-12-11 MED ORDER — MEMANTINE HCL ER 7 MG PO CP24
21.0000 mg | ORAL_CAPSULE | Freq: Every day | ORAL | Status: DC
  Administered 2018-12-11: 21 mg via ORAL
  Filled 2018-12-11: qty 3

## 2018-12-11 NOTE — TOC Progression Note (Addendum)
Transition of Care [facility]) - Progression Note    Patient Details  Name: Tara Henderson MRN: [id number] Date of Birth: 06-27-1945  Transition of Care [facility]) CM/SW [place], [place] Phone Number: 12/11/2018, 10:55 AM  Clinical Narrative:    11:28am- CSW spoke with Urban Gibson at [facility] who states she needs to consult with her leadership at the facility; while she was attempting to transfer call the line dropped. Re called facility x3, finally got through to [facility] again who took my name and number and will call me back. Will follow and re-engage today if no call back.  10:55am- Pt daughter and CSW discussed placement options this morning, pt daughter requested CSW call [facility] to see if returning to ALF with therapy would be a possibility.  Pt daughter provided [facility] number as [phone].   Expected Discharge Plan: [place] Barriers to Discharge: Continued Medical Work up  Expected Discharge Plan and Services Expected Discharge Plan: [place] In-house Referral: Clinical Social Work Discharge Planning Services: CM Consult Post Acute Care Choice: [place] arrangements for the past 2 months: [place]   Social Determinants of Health (SDOH) Interventions    Readmission Risk Interventions No flowsheet data found.

## 2018-12-11 NOTE — NC FL2 (Signed)
[place] MEDICAID FL2 LEVEL OF CARE SCREENING TOOL     IDENTIFICATION  Patient Name: Tara Henderson Birthdate: [birth year] Sex: female Admission Date (Current Location): 12/07/2018  [facility] and [place] Number:  [employer] and Address:  [facility] [place]. [facility] [place] [address] [place], [place] [address]      Provider Number: [id number]  Attending Physician Name and Address:  Little Ishikawa, MD  Relative Name and Phone Number:  Tara Henderson; daughter; [phone]    Current Level of Care: Hospital Recommended Level of Care: [place] Prior Approval Number:    Date Approved/Denied:   PASRR Number: [id number]  Discharge Plan: Other (Comment)([facility])    Current Diagnoses: Patient Active Problem List   Diagnosis Date Noted  . Closed fracture of right hip ([place])   . Fall   . Head injury   . Displaced intertrochanteric fracture of right femur ([place]) 12/07/2018  . Moderate dementia without behavioral disturbance ([place]) 11/09/2013    Orientation RESPIRATION BLADDER Height & Weight     Self  O2(2L nasal canula) Continent, External catheter Weight: 141 lb 1.5 oz (64 kg)(from 11/09/2013) Height:  5\' 3"  (160 cm)  BEHAVIORAL SYMPTOMS/MOOD NEUROLOGICAL BOWEL NUTRITION STATUS      Continent Diet(see discharge summary)  AMBULATORY STATUS COMMUNICATION OF NEEDS Skin   Extensive Assist Verbally Surgical wounds, Other (Comment)(incision on right leg with adhesive bandage)                       Personal Care Assistance Level of Assistance  Bathing, Feeding, Dressing Bathing Assistance: Maximum assistance Feeding assistance: Limited assistance Dressing Assistance: Maximum assistance     Functional Limitations Info  Sight, Hearing, Speech Sight Info: Adequate Hearing Info: Adequate Speech Info: Adequate    SPECIAL CARE FACTORS FREQUENCY  PT (By licensed PT), OT (By licensed OT)     PT Frequency: 5x week OT  Frequency: 5x week            Contractures Contractures Info: Not present    Additional Factors Info  Code Status, Allergies, Psychotropic Code Status Info: DNR Allergies Info: Keflex (Cephalexin), Penicillins Psychotropic Info: escitalopram (LEXAPRO) tablet 10 mg daily PO; memantine (NAMENDA XR) 24 hr capsule 21 mg daily PO         Current Medications (12/11/2018):  This is the current hospital active medication list Current Facility-Administered Medications  Medication Dose Route Frequency Provider Last Rate Last Dose  . acetaminophen (TYLENOL) tablet 650 mg  650 mg Oral Q6H WA Ainsley Spinner, PA-C   650 mg at 12/11/18 1006  . aspirin chewable tablet 81 mg  81 mg Oral Daily Ainsley Spinner, PA-C   81 mg at 12/11/18 1007  . atorvastatin (LIPITOR) tablet 20 mg  20 mg Oral Daily Ainsley Spinner, PA-C   20 mg at 12/11/18 1007  . calcium carbonate (OS-CAL - dosed in mg of elemental calcium) tablet 500 mg of elemental calcium  1 tablet Oral Q breakfast Ainsley Spinner, PA-C   500 mg of elemental calcium at 12/11/18 1006  . cholecalciferol (VITAMIN D3) tablet 2,000 Units  2,000 Units Oral BID Ainsley Spinner, PA-C   2,000 Units at 12/11/18 1007  . docusate sodium (COLACE) capsule 100 mg  100 mg Oral BID Ainsley Spinner, PA-C   100 mg at 12/11/18 1007  . enoxaparin (LOVENOX) injection 40 mg  40 mg Subcutaneous Q24H Ainsley Spinner, PA-C   40 mg at 12/11/18 1006  . escitalopram (  LEXAPRO) tablet 10 mg  10 mg Oral Daily Montez Moritaaul, Keith, PA-C   10 mg at 12/11/18 1007  . memantine (NAMENDA XR) 24 hr capsule 21 mg  21 mg Oral Daily Azucena FallenLancaster, William C, MD   21 mg at 12/11/18 1006  . menthol-cetylpyridinium (CEPACOL) lozenge 3 mg  1 lozenge Oral PRN Montez MoritaPaul, Keith, PA-C       Or  . phenol (CHLORASEPTIC) mouth spray 1 spray  1 spray Mouth/Throat PRN Montez MoritaPaul, Keith, PA-C      . metoCLOPramide (REGLAN) tablet 5-10 mg  5-10 mg Oral Q8H PRN Montez MoritaPaul, Keith, PA-C       Or  . metoCLOPramide (REGLAN) injection 5-10 mg  5-10 mg Intravenous Q8H  PRN Montez MoritaPaul, Keith, PA-C      . ondansetron [facility](ZOFRAN) tablet 4 mg  4 mg Oral Q6H PRN Montez MoritaPaul, Keith, PA-C       Or  . ondansetron [facility](ZOFRAN) injection 4 mg  4 mg Intravenous Q6H PRN Montez MoritaPaul, Keith, PA-C      . oxyCODONE (Oxy IR/ROXICODONE) immediate release tablet 2.5 mg  2.5 mg Oral Q6H PRN Rai, Ripudeep K, MD      . polyethylene glycol (MIRALAX / GLYCOLAX) packet 17 g  17 g Oral Daily PRN Amin, Ankit Chirag, MD      . senna (SENOKOT) tablet 8.6 mg  1 tablet Oral BID Montez MoritaPaul, Keith, PA-C   8.6 mg at 12/11/18 1007  . senna-docusate (Senokot-S) tablet 2 tablet  2 tablet Oral QHS PRN Amin, Ankit Chirag, MD      . vitamin C (ASCORBIC ACID) tablet 500 mg  500 mg Oral Daily Montez Moritaaul, Keith, PA-C   500 mg at 12/11/18 1007     Discharge Medications: STOP taking these medications   traZODone 50 MG tablet Commonly known as: DESYREL     TAKE these medications   acetaminophen 500 MG tablet Commonly known as: TYLENOL Take 1,000 mg by mouth 2 (two) times daily.   aspirin 81 MG chewable tablet Chew 81 mg by mouth daily.   atorvastatin 20 MG tablet Commonly known as: LIPITOR Take 1 tablet (20 mg total) by mouth daily.   docusate sodium 100 MG capsule Commonly known as: COLACE Take 100 mg by mouth daily as needed (constipation).   enoxaparin 40 MG/0.4ML injection Commonly known as: LOVENOX Inject 0.4 mLs (40 mg total) into the skin daily for 19 days. Start taking on: December 12, 2018   escitalopram 10 MG tablet Commonly known as: LEXAPRO Take 10 mg by mouth daily.   Namenda XR 21 MG Cp24 24 hr capsule Generic drug: memantine Take 21 mg by mouth daily.   Vitamin D3 25 MCG tablet Commonly known as: Vitamin D Take 2 tablets (2,000 Units total) by mouth 2 (two) times daily. What changed:   medication strength  when to take this        Relevant Imaging Results:  Relevant Lab Results:   Additional Information SS[address][id number]  Tara Henderson, [place]

## 2018-12-11 NOTE — Progress Notes (Signed)
Attempted to call report to [facility] with no success.  Will try again

## 2018-12-11 NOTE — Discharge Summary (Signed)
Physician Discharge Summary  Tara Henderson UUE:[id number] DOB: 1945/09/24 DOA: 12/07/2018  PCP: Sande Brothers, MD  Admit date: 12/07/2018 Discharge date: 12/11/2018  Admitted From: [facility] Disposition: [facility] assisted [facility] with hospice care  Recommendations for Outpatient Follow-up:  1. Follow up with PCP in 1-2 weeks; orthopedic surgery in 1 to 2 weeks as scheduled for wound evaluation and suture removal 2. Please obtain BMP/CBC in one week  Discharge Condition: Fair CODE STATUS: DNR Diet recommendation: Dysphagia 1 (puree);Thin liquid Liquids provided via: Cup;Straw Medication Administration: Crushed with puree Supervision: Full supervision/cueing for compensatory strategies;Staff to assist with self feeding Compensations: Slow rate;Small sips/bites;Other (Comment) Postural Changes and/or Swallow Maneuvers: Seated upright 90 degrees  Brief/Interim Summary:  Tara Henderson is a 73 y.o. female with medical history significant for dementia and hypertension who presented to the ED from her skilled nursing facility after 2 recent falls former which occurred 3 days ago in the most recent occurred on the day of presentation.  After today's fall, she had noticeable swelling and bruising of the right forehead and swelling deformity of the right hip.  EMS reported the patient was altered from her baseline with increased somnolence initially, however that has resolved since presentation.  Patient is unable to give any significant history at this time.  She endorses pain in her right hip and otherwise did not express any complaints.  After speaking with her daughter, Tara Henderson, Tara Henderson endorsed hip pain after her initial fall on Friday and was in a wheelchair subsequently. In the ED, patient afebrile, hemodynamically stable, on 2 L supplemental oxygen with saturations in the upper 90s.  Imaging notable for acute intertrochanteric right femoral fracture.  CT head/face/C-spine  showed probable nondisplaced posterior fourth rib fracture on the right, and soft tissue swelling around the right facial bone, without acute fracture of the face or C-spine noted.  Chest x-ray showed no acute abnormality.  Orthopedic surgery was consulted in the emergency department with plans for operative intervention.   Patient met as above with mechanical fall noted to have intertrochanteric right femur fracture status post ORIF on 12/08/2018 with orthopedic surgery.  Patient tolerated procedure quite well, PT OT recommending ongoing physical therapy but stable for return to assisted living facility from where she came.  At this time patient appears quite well, at baseline given advanced dementia as below but otherwise remains medically stable for discharge at this time.  Defer to orthopedic surgery for further DVT prophylaxis (Lovenox x 21 days), pain control at discharge, and for further scheduling follow-up outpatient appointment for wound evaluation and suture removal.  She to see PCP within 1 to 2 weeks as well for post hospital evaluation to ensure comorbid's remain controlled, patient's baseline anemia appears to be well controlled currently.  Patient otherwise stable for discharge to assisted living facility with hospice per family wishes.  Discharge Diagnoses:  Active Problems:   Displaced intertrochanteric fracture of right femur (HCC)   Closed fracture of right hip ([facility])   Fall   Head injury  Discharge Instructions  Discharge Instructions    Call MD for:  difficulty breathing, headache or visual disturbances   Complete by: As directed    Call MD for:  extreme fatigue   Complete by: As directed    Call MD for:  hives   Complete by: As directed    Call MD for:  persistant dizziness or light-headedness   Complete by: As directed    Call MD for:  persistant nausea  and vomiting   Complete by: As directed    Call MD for:  redness, tenderness, or signs of infection (pain, swelling,  redness, odor or green/yellow discharge around incision site)   Complete by: As directed    Call MD for:  severe uncontrolled pain   Complete by: As directed    Call MD for:  temperature >100.4   Complete by: As directed    Diet - low sodium heart healthy   Complete by: As directed    Increase activity slowly   Complete by: As directed      Allergies as of 12/11/2018      Reactions   Keflex [cephalexin] Other (See Comments)   Per SNF - unknown reaction   Penicillins Other (See Comments)   Per SNF - unknown reaction Did it involve swelling of the face/tongue/throat, SOB, or low BP? Unknown Did it involve sudden or severe rash/hives, skin peeling, or any reaction on the inside of your mouth or nose? Unknown Did you need to seek medical attention at a hospital or doctor's office? Unknown When did it last happen?unknown If all above answers are "NO", may proceed with cephalosporin use.      Medication List    STOP taking these medications   traZODone 50 MG tablet Commonly known as: DESYREL     TAKE these medications   acetaminophen 500 MG tablet Commonly known as: TYLENOL Take 1,000 mg by mouth 2 (two) times daily.   aspirin 81 MG chewable tablet Chew 81 mg by mouth daily.   atorvastatin 20 MG tablet Commonly known as: LIPITOR Take 1 tablet (20 mg total) by mouth daily.   docusate sodium 100 MG capsule Commonly known as: COLACE Take 100 mg by mouth daily as needed (constipation).   enoxaparin 40 MG/0.4ML injection Commonly known as: LOVENOX Inject 0.4 mLs (40 mg total) into the skin daily for 19 days. Start taking on: December 12, 2018   escitalopram 10 MG tablet Commonly known as: LEXAPRO Take 10 mg by mouth daily.   Namenda XR 21 MG Cp24 24 hr capsule Generic drug: memantine Take 21 mg by mouth daily.   Vitamin D3 25 MCG tablet Commonly known as: Vitamin D Take 2 tablets (2,000 Units total) by mouth 2 (two) times daily. What changed:   medication  strength  when to take this       Allergies  Allergen Reactions  . Keflex [Cephalexin] Other (See Comments)    Per SNF - unknown reaction  . Penicillins Other (See Comments)    Per SNF - unknown reaction Did it involve swelling of the face/tongue/throat, SOB, or low BP? Unknown Did it involve sudden or severe rash/hives, skin peeling, or any reaction on the inside of your mouth or nose? Unknown Did you need to seek medical attention at a hospital or doctor's office? Unknown When did it last happen?unknown If all above answers are "NO", may proceed with cephalosporin use.    Consultations:  Orthopedic surgery   Procedures/Studies: Dg Chest 1 View  Result Date: 12/07/2018 CLINICAL DATA:  Preop.  Pain status post fall EXAM: CHEST  1 VIEW COMPARISON:  02/16/2016 FINDINGS: There are chronic lung changes at the lung bases bilaterally favored to represent atelectasis or scarring. There is likely a background of emphysematous changes. There is no pneumothorax. No large pleural effusion. The heart size is normal. Aortic calcifications are noted. IMPRESSION: 1. No acute cardiopulmonary process identified. 2. Probable underlying emphysema. Electronically Signed   By: Harrell Gave  Green M.D.   On: 12/07/2018 18:39   Dg Knee 2 Views Right  Result Date: 12/07/2018 CLINICAL DATA:  Right leg pain EXAM: RIGHT KNEE - 1-2 VIEW COMPARISON:  None. FINDINGS: There is no acute displaced fracture. No dislocation. Mild degenerative changes are noted. There is no significant joint effusion. IMPRESSION: No acute osseous abnormality detected. Electronically Signed   By: Constance Holster M.D.   On: 12/07/2018 18:35   Ct Head Wo Contrast  Result Date: 12/07/2018 CLINICAL DATA:  Acute pain due to trauma EXAM: CT HEAD WITHOUT CONTRAST CT MAXILLOFACIAL WITHOUT CONTRAST CT CERVICAL SPINE WITHOUT CONTRAST TECHNIQUE: Multidetector CT imaging of the head, cervical spine, and maxillofacial structures were  performed using the standard protocol without intravenous contrast. Multiplanar CT image reconstructions of the cervical spine and maxillofacial structures were also generated. COMPARISON:  CT head dated June 11, 2018 FINDINGS: CT HEAD FINDINGS Brain: No evidence of acute infarction, hemorrhage, hydrocephalus, extra-axial collection or mass lesion/mass effect. Again noted is global atrophy and chronic microvascular ischemic changes. Vascular: No hyperdense vessel or unexpected calcification. Skull: Normal. Negative for fracture or focal lesion. There is frontal scalp swelling without evidence for an underlying fracture. Other: None. CT MAXILLOFACIAL FINDINGS Osseous: No fracture or mandibular dislocation. No destructive process. Orbits: Negative. No traumatic or inflammatory finding. Sinuses: Clear. Soft tissues: There is right frontal and right periorbital soft tissue swelling without evidence for an underlying fracture. CT CERVICAL SPINE FINDINGS Alignment: Normal. Skull base and vertebrae: No acute fracture. No primary bone lesion or focal pathologic process. Soft tissues and spinal canal: No prevertebral fluid or swelling. No visible canal hematoma. Disc levels: There is multilevel disc height loss throughout the cervical spine, greatest at the lower cervical levels. Upper chest: There may be a fracture of the posterior fourth rib on the right. This is only partially visualized. Other: None IMPRESSION: 1. No acute intracranial abnormality. 2. No facial fracture. 3. No acute cervical spine fracture. 4. Right frontal and periorbital soft tissue swelling without evidence for a fracture. 5. Possible nondisplaced fracture involving the posterior fourth rib on the right. This is only partially visualized. Electronically Signed   By: Constance Holster M.D.   On: 12/07/2018 19:37   Ct Cervical Spine Wo Contrast  Result Date: 12/07/2018 CLINICAL DATA:  Acute pain due to trauma EXAM: CT HEAD WITHOUT CONTRAST CT  MAXILLOFACIAL WITHOUT CONTRAST CT CERVICAL SPINE WITHOUT CONTRAST TECHNIQUE: Multidetector CT imaging of the head, cervical spine, and maxillofacial structures were performed using the standard protocol without intravenous contrast. Multiplanar CT image reconstructions of the cervical spine and maxillofacial structures were also generated. COMPARISON:  CT head dated June 11, 2018 FINDINGS: CT HEAD FINDINGS Brain: No evidence of acute infarction, hemorrhage, hydrocephalus, extra-axial collection or mass lesion/mass effect. Again noted is global atrophy and chronic microvascular ischemic changes. Vascular: No hyperdense vessel or unexpected calcification. Skull: Normal. Negative for fracture or focal lesion. There is frontal scalp swelling without evidence for an underlying fracture. Other: None. CT MAXILLOFACIAL FINDINGS Osseous: No fracture or mandibular dislocation. No destructive process. Orbits: Negative. No traumatic or inflammatory finding. Sinuses: Clear. Soft tissues: There is right frontal and right periorbital soft tissue swelling without evidence for an underlying fracture. CT CERVICAL SPINE FINDINGS Alignment: Normal. Skull base and vertebrae: No acute fracture. No primary bone lesion or focal pathologic process. Soft tissues and spinal canal: No prevertebral fluid or swelling. No visible canal hematoma. Disc levels: There is multilevel disc height loss throughout the cervical spine, greatest at  the lower cervical levels. Upper chest: There may be a fracture of the posterior fourth rib on the right. This is only partially visualized. Other: None IMPRESSION: 1. No acute intracranial abnormality. 2. No facial fracture. 3. No acute cervical spine fracture. 4. Right frontal and periorbital soft tissue swelling without evidence for a fracture. 5. Possible nondisplaced fracture involving the posterior fourth rib on the right. This is only partially visualized. Electronically Signed   By: Constance Holster M.D.    On: 12/07/2018 19:37   Pelvis Portable  Result Date: 12/08/2018 CLINICAL DATA:  Fracture. EXAM: PORTABLE PELVIS 1-2 VIEWS COMPARISON:  12/07/2018. FINDINGS: Diffuse osteopenia and degenerative change. ORIF right hip fracture. No other fractures identified. No evidence of dislocation. Pelvic calcifications consistent phleboliths. IMPRESSION: 1. ORIF right hip. Hardware intact. Anatomic alignment. No other acute fractures identified. 2.  Diffuse osteopenia degenerative change. 3.  Aortoiliac atherosclerotic vascular disease. Electronically Signed   By: Marcello Moores  Register   On: 12/08/2018 12:31   Dg C-arm 1-60 Min  Result Date: 12/08/2018 CLINICAL DATA:  ORIF of right femoral fracture EXAM: RIGHT FEMUR 2 VIEWS; DG C-ARM 1-60 MIN COMPARISON:  None. FLUOROSCOPY TIME:  Radiation Exposure Index (as provided by the fluoroscopic device): Not available If the device does not provide the exposure index: Fluoroscopy Time:  1 minutes 23 seconds Number of Acquired Images:  4 FINDINGS: Medullary rod is noted within the right femur. Proximal and distal fixation screws are seen. The fracture fragments are in near anatomic alignment. IMPRESSION: Status post ORIF of right femoral fracture Electronically Signed   By: Inez Catalina M.D.   On: 12/08/2018 09:58   Dg Hip Unilat W Or Wo Pelvis 2-3 Views Right  Result Date: 12/07/2018 CLINICAL DATA:  Pain EXAM: DG HIP (WITH OR WITHOUT PELVIS) 2-3V RIGHT COMPARISON:  None. FINDINGS: There is an acute displaced comminuted intratrochanteric fracture of the proximal right femur. There is no dislocation. There are degenerative changes of both hips. IMPRESSION: Acute intratrochanteric fracture of the right femur. Electronically Signed   By: Constance Holster M.D.   On: 12/07/2018 18:34   Dg Femur, Min 2 Views Right  Result Date: 12/08/2018 CLINICAL DATA:  ORIF of right femoral fracture EXAM: RIGHT FEMUR 2 VIEWS; DG C-ARM 1-60 MIN COMPARISON:  None. FLUOROSCOPY TIME:  Radiation  Exposure Index (as provided by the fluoroscopic device): Not available If the device does not provide the exposure index: Fluoroscopy Time:  1 minutes 23 seconds Number of Acquired Images:  4 FINDINGS: Medullary rod is noted within the right femur. Proximal and distal fixation screws are seen. The fracture fragments are in near anatomic alignment. IMPRESSION: Status post ORIF of right femoral fracture Electronically Signed   By: Inez Catalina M.D.   On: 12/08/2018 09:58   Dg Femur Port, Min 2 Views Right  Result Date: 12/08/2018 CLINICAL DATA:  Fracture EXAM: RIGHT FEMUR PORTABLE 2 VIEW COMPARISON:  12/08/2018 FINDINGS: Interval intramedullary rod and nail fixation of an intertrochanteric fracture of the right femoral neck with displaced fragments of the lesser trochanter. There is otherwise anatomic alignment of the fracture. Expected postoperative soft tissue changes of the overlying right thigh. IMPRESSION: Interval intramedullary rod and nail fixation of an intertrochanteric fracture of the right femoral neck with displaced fragments of the lesser trochanter. There is otherwise anatomic alignment of the fracture. Expected postoperative soft tissue changes of the overlying right thigh. Electronically Signed   By: Eddie Candle M.D.   On: 12/08/2018 12:32   Ct Maxillofacial  Wo Contrast  Result Date: 12/07/2018 CLINICAL DATA:  Acute pain due to trauma EXAM: CT HEAD WITHOUT CONTRAST CT MAXILLOFACIAL WITHOUT CONTRAST CT CERVICAL SPINE WITHOUT CONTRAST TECHNIQUE: Multidetector CT imaging of the head, cervical spine, and maxillofacial structures were performed using the standard protocol without intravenous contrast. Multiplanar CT image reconstructions of the cervical spine and maxillofacial structures were also generated. COMPARISON:  CT head dated June 11, 2018 FINDINGS: CT HEAD FINDINGS Brain: No evidence of acute infarction, hemorrhage, hydrocephalus, extra-axial collection or mass lesion/mass effect. Again  noted is global atrophy and chronic microvascular ischemic changes. Vascular: No hyperdense vessel or unexpected calcification. Skull: Normal. Negative for fracture or focal lesion. There is frontal scalp swelling without evidence for an underlying fracture. Other: None. CT MAXILLOFACIAL FINDINGS Osseous: No fracture or mandibular dislocation. No destructive process. Orbits: Negative. No traumatic or inflammatory finding. Sinuses: Clear. Soft tissues: There is right frontal and right periorbital soft tissue swelling without evidence for an underlying fracture. CT CERVICAL SPINE FINDINGS Alignment: Normal. Skull base and vertebrae: No acute fracture. No primary bone lesion or focal pathologic process. Soft tissues and spinal canal: No prevertebral fluid or swelling. No visible canal hematoma. Disc levels: There is multilevel disc height loss throughout the cervical spine, greatest at the lower cervical levels. Upper chest: There may be a fracture of the posterior fourth rib on the right. This is only partially visualized. Other: None IMPRESSION: 1. No acute intracranial abnormality. 2. No facial fracture. 3. No acute cervical spine fracture. 4. Right frontal and periorbital soft tissue swelling without evidence for a fracture. 5. Possible nondisplaced fracture involving the posterior fourth rib on the right. This is only partially visualized. Electronically Signed   By: Constance Holster M.D.   On: 12/07/2018 19:37    Subjective: No acute issues or events noted overnight -review of systems quite poor per patient given baseline mental status/dementia   Discharge Exam: Vitals:   12/10/18 2120 12/11/18 0631  BP: (!) 94/49 133/71  Pulse: 79 66  Resp: 17 15  Temp: 98.1 F (36.7 C) 98.6 F (37 C)  SpO2: 94% 99%   Vitals:   12/10/18 0443 12/10/18 1407 12/10/18 2120 12/11/18 0631  BP: 124/63 (!) 106/58 (!) 94/49 133/71  Pulse: 70 77 79 66  Resp: _0 Temp: (!) 97.5 F (36.4 C) 99 F (37.2 C)  98.1 F (36.7 C) 98.6 F (37 C)  TempSrc: Oral Oral Oral Axillary  SpO2:  (!) 85% 94% 99%  Weight:      Height:        General:  Pleasantly resting in bed, No acute distress. HEENT:  Normocephalic atraumatic.  Sclerae nonicteric, noninjected.  Extraocular movements intact bilaterally. Neck:  Without mass or deformity.  Trachea is midline. Lungs:  Clear to auscultate bilaterally without rhonchi, wheeze, or rales. Heart:  Regular rate and rhythm.  Without murmurs, rubs, or gallops. Abdomen:  Soft, nontender, nondistended.  Without guarding or rebound. Extremities: Without cyanosis, clubbing, edema, or obvious deformity. Vascular:  Dorsalis pedis and posterior tibial pulses palpable bilaterally. Skin:  Warm and dry, no erythema, no ulcerations.   The results of significant diagnostics from this hospitalization (including imaging, microbiology, ancillary and laboratory) are listed below for reference.     Microbiology: Recent Results (from the past 240 hour(s))  SARS Coronavirus 2 [facility] order, Performed in [facility] hospital lab) Nasopharyngeal Nasopharyngeal Swab     Status: None   Collection Time: 12/07/18  7:20 PM   Specimen:  Nasopharyngeal Swab  Result Value Ref Range Status   SARS Coronavirus 2 NEGATIVE NEGATIVE Final    Comment: (NOTE) If result is NEGATIVE SARS-CoV-2 target nucleic acids are NOT DETECTED. The SARS-CoV-2 RNA is generally detectable in upper and lower  respiratory specimens during the acute phase of infection. The lowest  concentration of SARS-CoV-2 viral copies this assay can detect is 250  copies / mL. A negative result does not preclude SARS-CoV-2 infection  and should not be used as the sole basis for treatment or other  patient management decisions.  A negative result may occur with  improper specimen collection / handling, submission of specimen other  than nasopharyngeal swab, presence of viral mutation(s) within the  areas targeted by this  assay, and inadequate number of viral copies  (<250 copies / mL). A negative result must be combined with clinical  observations, patient history, and epidemiological information. If result is POSITIVE SARS-CoV-2 target nucleic acids are DETECTED. The SARS-CoV-2 RNA is generally detectable in upper and lower  respiratory specimens dur ing the acute phase of infection.  Positive  results are indicative of active infection with SARS-CoV-2.  Clinical  correlation with patient history and other diagnostic information is  necessary to determine patient infection status.  Positive results do  not rule out bacterial infection or co-infection with other viruses. If result is PRESUMPTIVE POSTIVE SARS-CoV-2 nucleic acids MAY BE PRESENT.   A presumptive positive result was obtained on the submitted specimen  and confirmed on repeat testing.  While 2019 novel coronavirus  (SARS-CoV-2) nucleic acids may be present in the submitted sample  additional confirmatory testing may be necessary for epidemiological  and / or clinical management purposes  to differentiate between  SARS-CoV-2 and other Sarbecovirus currently known to infect humans.  If clinically indicated additional testing with an alternate test  methodology [phone]) is advised. The SARS-CoV-2 RNA is generally  detectable in upper and lower respiratory sp ecimens during the acute  phase of infection. The expected result is Negative. Fact Sheet for Patients:  StrictlyIdeas.no Fact Sheet for Healthcare Providers: BankingDealers.co.za This test is not yet approved or cleared by the [place] FDA and has been authorized for detection and/or diagnosis of SARS-CoV-2 by FDA under an Emergency Use Authorization (EUA).  This EUA will remain in effect (meaning this test can be used) for the duration of the COVID-19 declaration under Section 564(b)(1) of the Act, 21 U.S.C. section 360bbb-3(b)(1),  unless the authorization is terminated or revoked sooner. Performed at [facility], [place] [address] [place], [place] [address]   Surgical PCR screen     Status: None   Collection Time: 12/08/18  1:44 AM   Specimen: Nasal Mucosa; Nasal Swab  Result Value Ref Range Status   MRSA, PCR NEGATIVE NEGATIVE Final   Staphylococcus aureus NEGATIVE NEGATIVE Final    Comment: (NOTE) The Xpert SA Assay (FDA approved for NASAL specimens in patients 30 years of age and older), is one component of a comprehensive surveillance program. It is not intended to diagnose infection nor to guide or monitor treatment. Performed at [facility], [place] [address] [place], [place] [address]      Labs:  Basic Metabolic Panel: Recent Labs  Lab 12/07/18 1839 12/08/18 1159 12/09/18 0125 12/10/18 0254 12/11/18 0411  NA 139 139 133* 142 145  K 4.7 4.3 4.1 4.3 4.3  CL 103 106 101 104 106  CO2 _0 GLUCOSE 166* 166* 137*  110* 103*  BUN 27* _0 28*  CREATININE 1.00 0.85 0.98 0.81 0.79  CALCIUM 8.8* 8.4* 8.6* 8.4* 8.8*  MG  --   --  2.1 2.1 2.1   Liver Function Tests: Recent Labs  Lab 12/07/18 2300  AST 22  ALT 16  ALKPHOS 85  BILITOT 0.6  PROT 6.6  ALBUMIN 3.5   CBC: Recent Labs  Lab 12/07/18 1839 12/08/18 1159 12/09/18 0125 12/10/18 0254 12/11/18 0411  WBC 13.6* 13.2* 10.4 9.6 7.7  NEUTROABS 11.8*  --   --   --   --   HGB 12.4 9.2* 7.5* 9.3* 9.3*  HCT 39.6 28.8* 24.0* 28.8* 29.0*  MCV 94.3 90.9 93.8 91.7 93.2  PLT 276 246 213 195 268    Urinalysis    Component Value Date/Time   COLORURINE YELLOW 09/24/2016 [place] 09/24/2016 1644   LABSPEC 1.021 09/24/2016 1644   PHURINE 5.0 09/24/2016 [place] 09/24/2016 1644   HGBUR NEGATIVE 09/24/2016 [place] 09/24/2016 [place] 09/24/2016 1644   PROTEINUR NEGATIVE 09/24/2016 1644   NITRITE NEGATIVE 09/24/2016 [place]  (A) 09/24/2016 1644   Microbiology Recent Results (from the past 240 hour(s))  SARS Coronavirus 2 [facility] order, Performed in [facility] hospital lab) Nasopharyngeal Nasopharyngeal Swab     Status: None   Collection Time: 12/07/18  7:20 PM   Specimen: Nasopharyngeal Swab  Result Value Ref Range Status   SARS Coronavirus 2 NEGATIVE NEGATIVE Final    Comment: (NOTE) If result is NEGATIVE SARS-CoV-2 target nucleic acids are NOT DETECTED. The SARS-CoV-2 RNA is generally detectable in upper and lower  respiratory specimens during the acute phase of infection. The lowest  concentration of SARS-CoV-2 viral copies this assay can detect is 250  copies / mL. A negative result does not preclude SARS-CoV-2 infection  and should not be used as the sole basis for treatment or other  patient management decisions.  A negative result may occur with  improper specimen collection / handling, submission of specimen other  than nasopharyngeal swab, presence of viral mutation(s) within the  areas targeted by this assay, and inadequate number of viral copies  (<250 copies / mL). A negative result must be combined with clinical  observations, patient history, and epidemiological information. If result is POSITIVE SARS-CoV-2 target nucleic acids are DETECTED. The SARS-CoV-2 RNA is generally detectable in upper and lower  respiratory specimens dur ing the acute phase of infection.  Positive  results are indicative of active infection with SARS-CoV-2.  Clinical  correlation with patient history and other diagnostic information is  necessary to determine patient infection status.  Positive results do  not rule out bacterial infection or co-infection with other viruses. If result is PRESUMPTIVE POSTIVE SARS-CoV-2 nucleic acids MAY BE PRESENT.   A presumptive positive result was obtained on the submitted specimen  and confirmed on repeat testing.  While 2019 novel coronavirus  (SARS-CoV-2) nucleic acids may  be present in the submitted sample  additional confirmatory testing may be necessary for epidemiological  and / or clinical management purposes  to differentiate between  SARS-CoV-2 and other Sarbecovirus currently known to infect humans.  If clinically indicated additional testing with an alternate test  methodology [phone]) is advised. The SARS-CoV-2 RNA is generally  detectable in upper and lower respiratory sp ecimens during the acute  phase of infection. The expected result is Negative. Fact Sheet for Patients:  StrictlyIdeas.no Fact Sheet for Healthcare Providers: BankingDealers.co.za This test is not yet approved or cleared by the [place] FDA and has been authorized for detection and/or diagnosis of SARS-CoV-2 by FDA under an Emergency Use Authorization (EUA).  This EUA will remain in effect (meaning this test can be used) for the duration of the COVID-19 declaration under Section 564(b)(1) of the Act, 21 U.S.C. section 360bbb-3(b)(1), unless the authorization is terminated or revoked sooner. Performed at [facility], [place] [address] [place], [place] [address]   Surgical PCR screen     Status: None   Collection Time: 12/08/18  1:44 AM   Specimen: Nasal Mucosa; Nasal Swab  Result Value Ref Range Status   MRSA, PCR NEGATIVE NEGATIVE Final   Staphylococcus aureus NEGATIVE NEGATIVE Final    Comment: (NOTE) The Xpert SA Assay (FDA approved for NASAL specimens in patients 76 years of age and older), is one component of a comprehensive surveillance program. It is not intended to diagnose infection nor to guide or monitor treatment. Performed at [facility], [place] [address], [place], [place] [address]    Time coordinating discharge: Over 30 minutes  SIGNED:  Little Ishikawa, DO [facility] 12/11/2018, 2:22 PM Pager   If 7PM-7AM, please contact night-coverage [email or web address] Password [email or web address]

## 2018-12-11 NOTE — Progress Notes (Signed)
Physical Therapy Treatment Patient Details Name: Tara Henderson MRN: [id number] DOB: Apr 06, 1946 Today's Date: 12/11/2018    History of Present Illness Pt is a 73 y.o. female admitted from SNF on 12/07/18 after falls sustaining R forehead and R hip injury. Workup revealed R femur fx, probable R 4th rib fx. Now s/p R intertrochantric IM nail 8/31. PMH includes dementia, HTN, osteoporosis.    PT Comments    Pt more alert and interactive. Smiling but no purposely verbalizations. She required max assist supine to sit. Attempted sit to stand max/total assist without success. Pt resisting transition to stand/pulling back posteriorly. She sat EOB x 5 minutes, requiring mod/max assist due to continual attempts to return to supine in bed. Mod assist required for sit to supine. Pt tolerated PROM RLE through small arc of movement. Pt in bed at end of session due to unable to safely transfer to recliner.     Follow Up Recommendations  SNF;Supervision/Assistance - 24 hour     Equipment Recommendations  Other (comment)(defer to next venue)    Recommendations for Other Services       Precautions / Restrictions Precautions Precautions: Fall Restrictions Weight Bearing Restrictions: Yes RLE Weight Bearing: Weight bearing as tolerated    Mobility  Bed Mobility Overal bed mobility: Needs Assistance Bed Mobility: Rolling;Supine to Sit;Sit to Supine Rolling: Mod assist   Supine to sit: Max assist;HOB elevated Sit to supine: Mod assist   General bed mobility comments: decreased initiation. Assist with BLE and trunk. Use of bed pad to scoot to EOB.  Transfers Overall transfer level: Needs assistance Equipment used: None Transfers: Sit to/from Stand Sit to Stand: Max assist;Total assist         General transfer comment: Attempted sit to stand from EOB. Unable to attain full upright stance due to pt resisting/pushing posteriorly (possibly in response to pain). Able to clear bottom a few inches  prior to return to sit. Unable to safely transfer to chair.  Ambulation/Gait             General Gait Details: unable   Stairs             Wheelchair Mobility    Modified Rankin (Stroke Patients Only)       Balance Overall balance assessment: History of Falls;Needs assistance Sitting-balance support: No upper extremity supported;Feet supported Sitting balance-Leahy Scale: Poor Sitting balance - Comments: Difficult to assess due to pt continually trying to return to supine in bed. Pt sat EOB x 5 minutes mod/max assist.                                    Cognition Arousal/Alertness: Awake/alert Behavior During Therapy: WFL for tasks assessed/performed Overall Cognitive Status: History of cognitive impairments - at baseline                                 General Comments: dementia at baseline. Resides in memory care unit in ALF.      Exercises Other Exercises Other Exercises: Pt tolerated PROM RLE hip/knee through small arc of movement. Grimacing noted.    General Comments        Pertinent Vitals/Pain Pain Assessment: Faces Pain Score: 0-No pain Faces Pain Scale: Hurts even more Pain Location: R hip during mobility Pain Descriptors / Indicators: Grimacing;Guarding Pain Intervention(s): Limited activity within patient's tolerance;Repositioned;Monitored during session  Home Living                      Prior Function            PT Goals (current goals can now be found in the care plan section) Acute Rehab PT Goals Patient Stated Goal: not able to state PT Goal Formulation: With family Time For Goal Achievement: 12/23/18 Potential to Achieve Goals: Good Progress towards PT goals: Progressing toward goals    Frequency    Min 3X/week      PT Plan Current plan remains appropriate    Co-evaluation              AM-PAC PT "6 Clicks" Mobility   Outcome Measure  Help needed turning from your back  to your side while in a flat bed without using bedrails?: A Lot Help needed moving from lying on your back to sitting on the side of a flat bed without using bedrails?: A Lot Help needed moving to and from a bed to a chair (including a wheelchair)?: Total Help needed standing up from a chair using your arms (e.g., wheelchair or bedside chair)?: Total Help needed to walk in hospital room?: Total Help needed climbing 3-5 steps with a railing? : Total 6 Click Score: 8    End of Session Equipment Utilized During Treatment: Gait belt Activity Tolerance: Patient limited by pain;Other (comment)(dementia) Patient left: in bed;with call bell/phone within reach;with bed alarm set;with SCD's reapplied Nurse Communication: Mobility status PT Visit Diagnosis: History of falling (Z91.81);Muscle weakness (generalized) (M62.81);Pain Pain - Right/Left: Right Pain - part of body: Hip     Time: 6553-7482 PT Time Calculation (min) (ACUTE ONLY): 16 min  Charges:  $Therapeutic Activity: 8-22 mins                     Lorrin Goodell, PT  Office # [phone] Pager [phone]    Tara Henderson 12/11/2018, 11:28 AM

## 2018-12-11 NOTE — Social Work (Signed)
Clinical Social Worker facilitated patient discharge including contacting patient family and facility to confirm patient discharge plans.  Clinical information faxed to facility and family agreeable with plan.  CSW arranged ambulance transport via [facility] to [place] at 5:30pm RN to call [phone]  with report prior to discharge. (please attempt at least 3x)  Clinical Social Worker will sign off for now as social work intervention is no longer needed. Please consult [place] again if new need arises.  Westley Hummer, MSW, [place] Social Worker [phone]

## 2018-12-11 NOTE — TOC Transition Note (Signed)
Transition of Care [facility]) - CM/SW Discharge Note   Patient Details  Name: Tara Henderson MRN: [id number] Date of Birth: 04/14/1945  Transition of Care [facility]) CM/SW Contact:  Alexander [place], [place] Phone Number: 12/11/2018, 2:07 PM   Clinical Narrative:    After conversations with the ALF pt daughter has decided to bring pt back to her facility with hospice care to [facility] in [place]. ALF has requested a new FL2 (fax [phone]) and for her to have a hospital bed delivered. Pt daughter in agreement with referral to [facility] spoke with Cassandra and made referral and will await delivery of bed to ALF to arrange [facility] transport back to ALF.   Final next level of care: Memory Care Barriers to Discharge: Equipment Delay   Patient Goals and CMS Choice Patient states their goals for this hospitalization and ongoing recovery are:: for her to be able to mobilize again CMS Medicare.gov Compare Post Acute Care list provided to:: Patient Represenative (must comment)(pt daughter Charleston Ropes) Choice offered to / list presented to : Adult Children  Discharge Placement              Patient chooses bed at: Other - please specify in the comment section below:([facility]) Patient to be transferred to facility by: [place] Name of family member notified: pt daughter Charleston Ropes Patient and family notified of of transfer: 12/11/18  Discharge Plan and Services In-house Referral: Clinical Social Work Discharge Planning Services: [employer] Consult Post Acute Care Choice: [place]          DME Arranged: Hospital bed DME Agency: Other - Comment([facility]) Date DME Agency Contacted: 12/11/18   Representative spoke with at DME Agency: [place] ([place]) Interventions     Readmission Risk Interventions No flowsheet data found.

## 2018-12-11 NOTE — Progress Notes (Signed)
  Speech Language Pathology Treatment: Dysphagia  Patient Details Name: TANAYAH SQUITIERI MRN: [id number] DOB: 04-Jul-1945 Today's Date: 12/11/2018 Time: 0745-0800 SLP Time Calculation (min) (ACUTE ONLY): 15 min  Assessment / Plan / Recommendation Clinical Impression  SLP followed up for continued dysphagia intervention. Pt alert during SLP interaction however unable to follow simple commands, dysphagia appears to be mostly cognitive based. SLP assessed pt with small amounts of thin liquids, puree, and small piece of solid PO. Pt unable to cognitive sequence adequate mastication with solid PO. Oral readiness was variable with pt exhibiting some labial tension to POs. Swallow initiation appeared delayed per palpation. No overt s/sx of aspiration were exhibited. RN reports intake remains limited. Recommend dysphagia 1 (puree) and thin liquids with safe swallowing precautions.    HPI HPI: Josefita Weissmann is a 73 y.o. female with hx of dementia and hypertension admitted 8/30 from SNF after a fall with R hip fracture and buising to the R eye and forehead. Head CT showed no acute intracranial abnormality or fracture.  Pt was alert but oriented to person only as of 8/30. She recieved orthopedic surgery on her R hip 8/31. SW has been in contact with her daughter, Charleston Ropes on consult for SNF placement.       SLP Plan  Continue with current plan of care       Recommendations  Diet recommendations: Dysphagia 1 (puree);Thin liquid Liquids provided via: Cup;Straw Medication Administration: Crushed with puree Supervision: Full supervision/cueing for compensatory strategies;Staff to assist with self feeding Compensations: Slow rate;Small sips/bites;Other (Comment) Postural Changes and/or Swallow Maneuvers: Seated upright 90 degrees                Oral Care Recommendations: Oral care QID Follow up Recommendations: Skilled Nursing facility SLP Visit Diagnosis: Dysphagia, unspecified (R13.10) Plan: Continue  with current plan of care       [place], [place] 12/11/2018, 8:07 AM

## 2018-12-11 NOTE — Progress Notes (Signed)
Patient discharged to SNF in stable condition. Discharge papers given to [facility] staff and to transport patient.

## 2019-03-10 DEATH — deceased

## 2020-02-02 IMAGING — CT CT HEAD WITHOUT CONTRAST
4 of 14 series · 15 of 47 positions shown, 18 images · non-contrast
Comparison: CT head dated June 11, 2018

CLINICAL DATA: Acute pain due to trauma

EXAM:
CT HEAD WITHOUT CONTRAST
CT MAXILLOFACIAL WITHOUT CONTRAST
CT CERVICAL SPINE WITHOUT CONTRAST
TECHNIQUE: Multidetector CT imaging of the head, cervical spine, and
maxillofacial structures were performed using the standard protocol
without intravenous contrast. Multiplanar CT image reconstructions
of the cervical spine and maxillofacial structures were also
generated.

[Series 9: 1.0 thin soft tissue · axial · 0.34mm/px · z∈[-125,-8]mm · 9 of 147 slices shown, 12 images]
[im 15/147  brain]
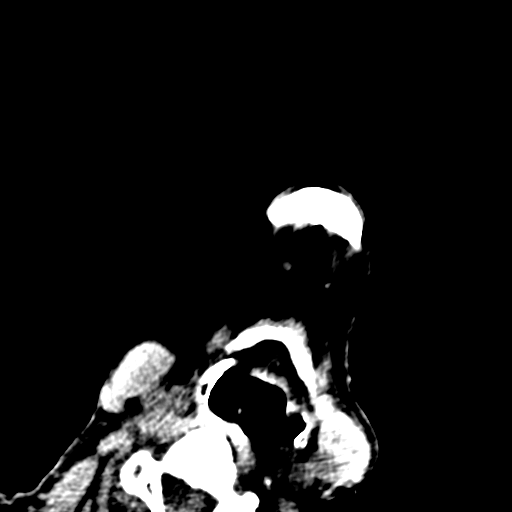
[im 15/147  bone]
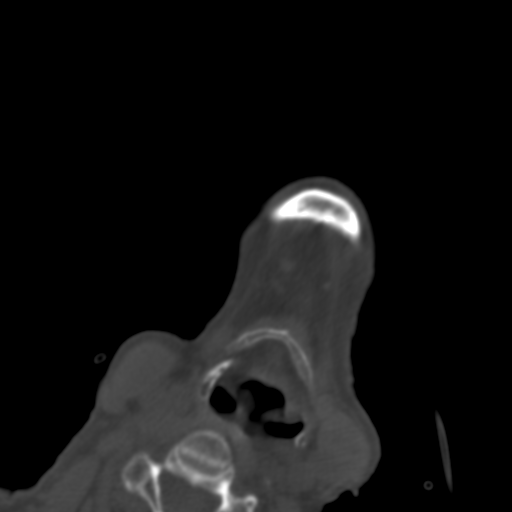
[im 30/147  brain]
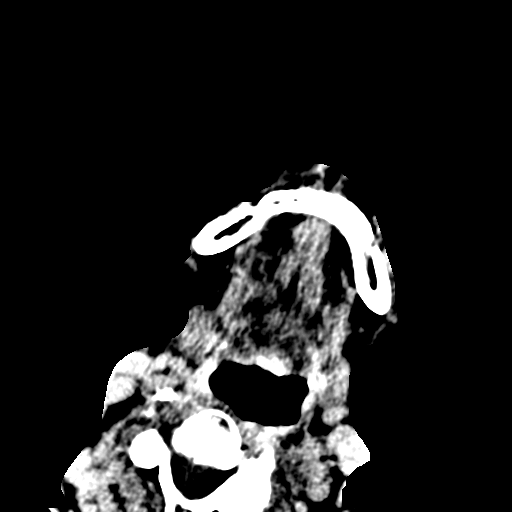
[im 44/147  brain]
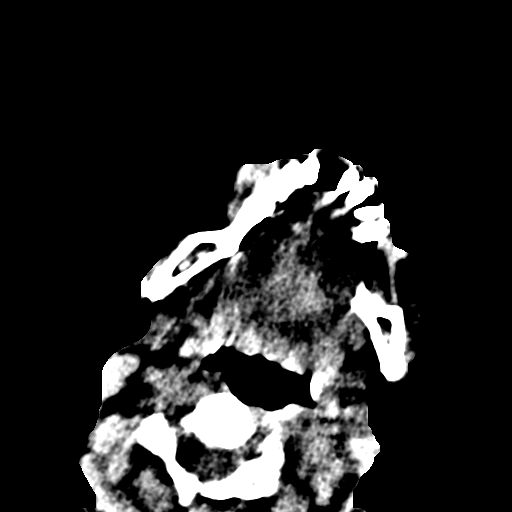
[im 59/147  brain]
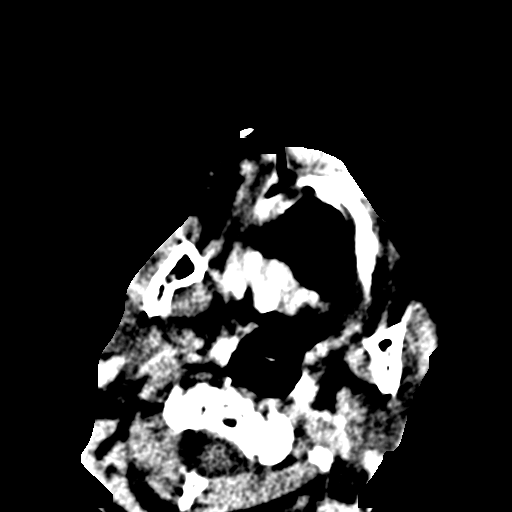
[im 74/147  brain]
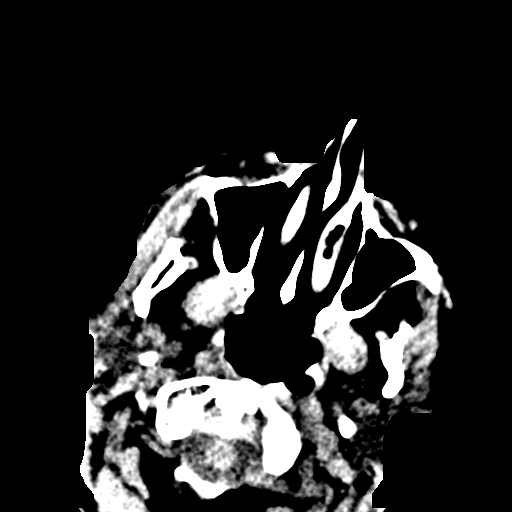
[im 74/147  bone]
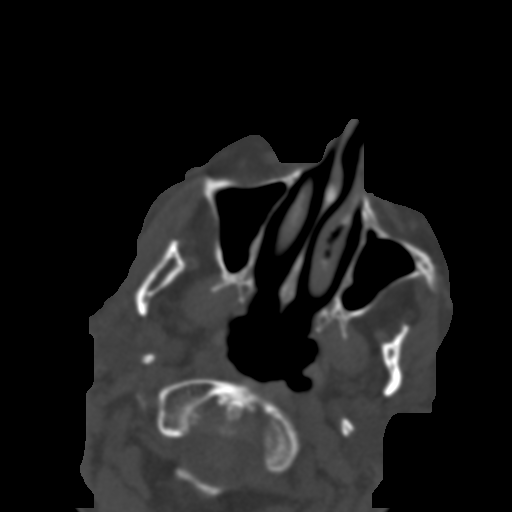
[im 88/147  brain]
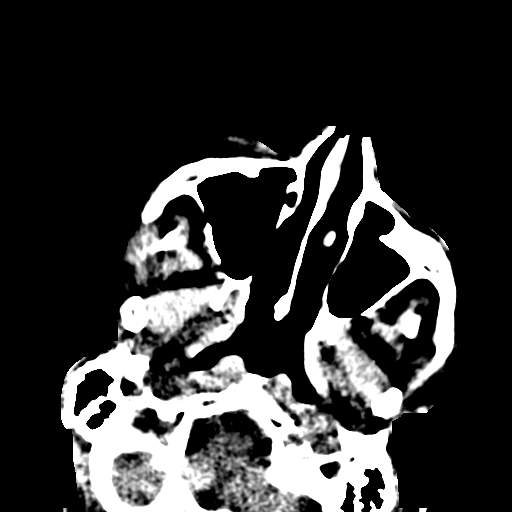
[im 103/147  brain]
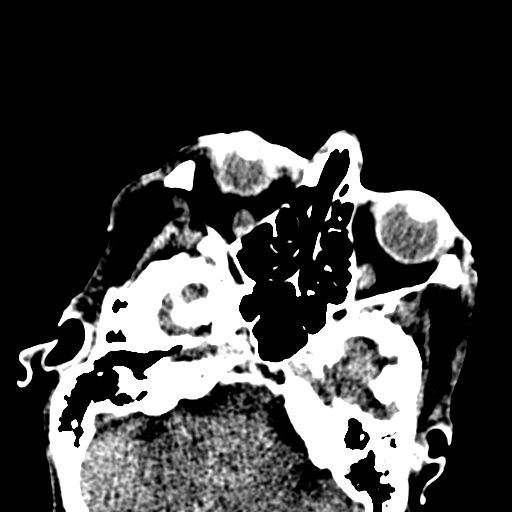
[im 117/147  brain]
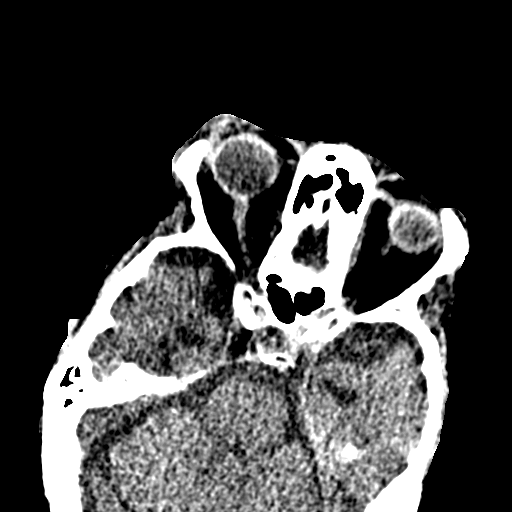
[im 132/147  brain]
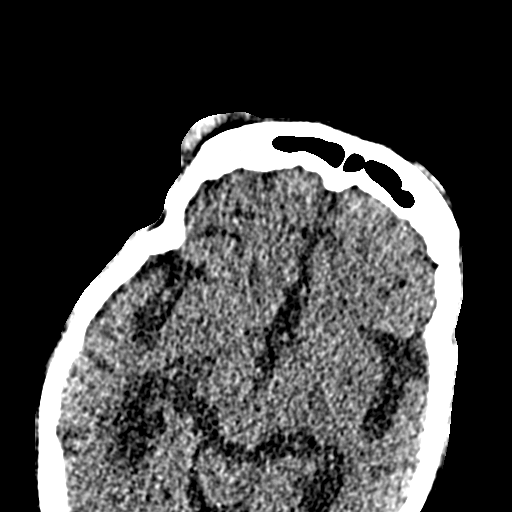
[im 132/147  bone]
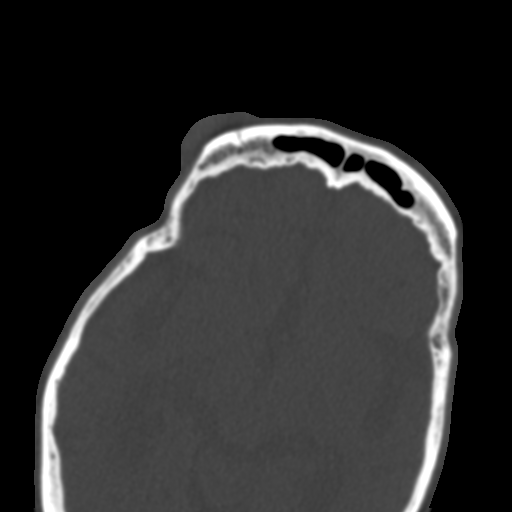

[Series 11: coronal soft tissue · coronal · 0.31mm/px · 2 of 75 slices shown]
[im 25/75  brain]
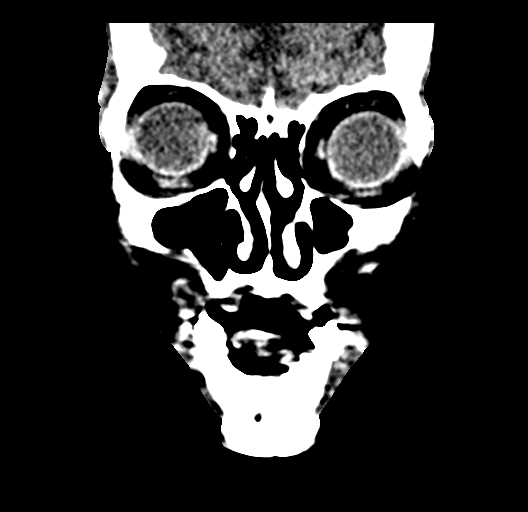
[im 50/75  brain]
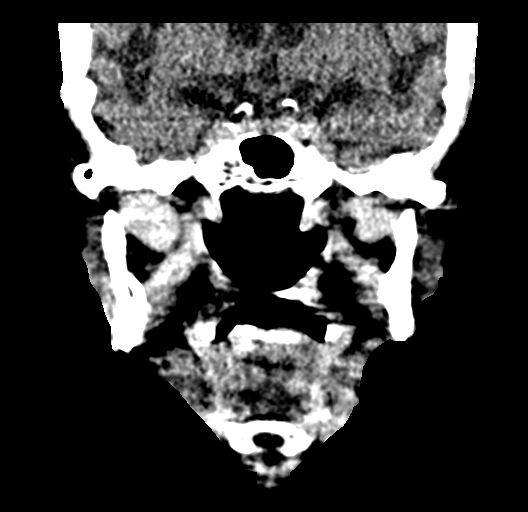

[Series 18: c_spine 2.0 3 st · axial · 0.34mm/px · z∈[-140,-104]mm · 2 of 73 slices shown]
[im 19/73  brain]
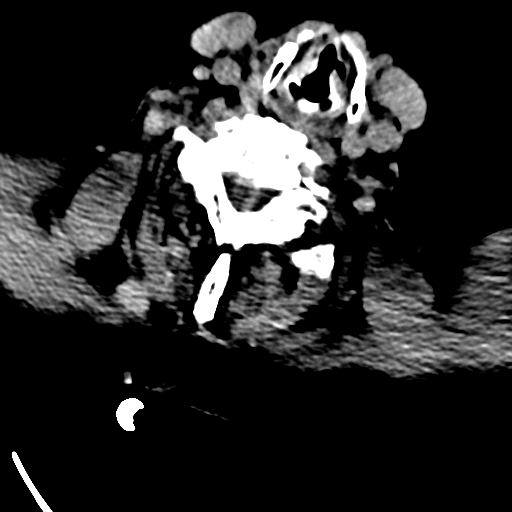
[im 37/73  brain]
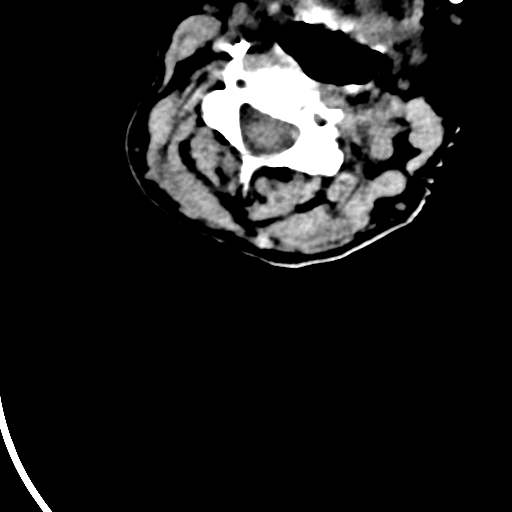

[Series 30: orthogonal axials st · axial · 0.21mm/px · z∈[-126,-89]mm · 2 of 54 slices shown]
[im 18/54  brain]
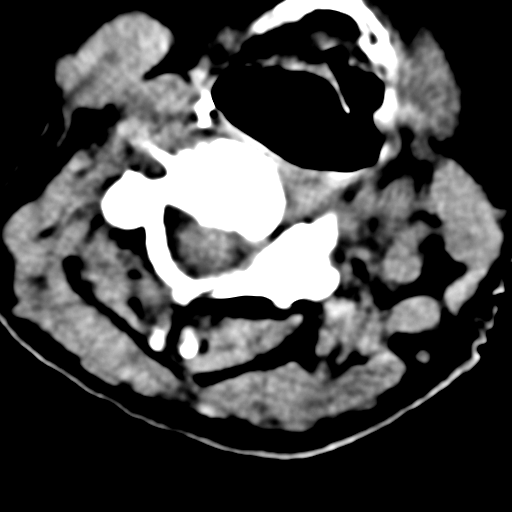
[im 36/54  brain]
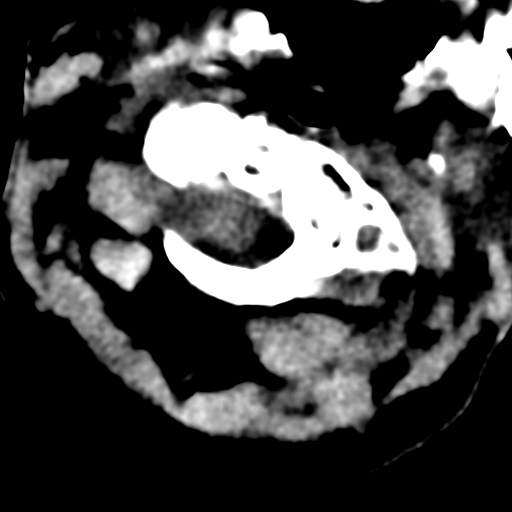

[15 of 47 positions shown; findings below may reference images not displayed]

FINDINGS: CT HEAD FINDINGS

Brain: No evidence of acute infarction, hemorrhage, hydrocephalus,
extra-axial collection or mass lesion/mass effect. Again noted is
global atrophy and chronic microvascular ischemic changes.

Vascular: No hyperdense vessel or unexpected calcification.

Skull: Normal. Negative for fracture or focal lesion. There is
frontal scalp swelling without evidence for an underlying fracture.

Other: None.

CT MAXILLOFACIAL FINDINGS

Osseous: No fracture or mandibular dislocation. No destructive
process.

Orbits: Negative. No traumatic or inflammatory finding.

Sinuses: Clear.

Soft tissues: There is right frontal and right periorbital soft
tissue swelling without evidence for an underlying fracture.

CT CERVICAL SPINE FINDINGS

Alignment: Normal.

Skull base and vertebrae: No acute fracture. No primary bone lesion
or focal pathologic process.

Soft tissues and spinal canal: No prevertebral fluid or swelling. No
visible canal hematoma.

Disc levels: There is multilevel disc height loss throughout the
cervical spine, greatest at the lower cervical levels.

Upper chest: There may be a fracture of the posterior fourth rib on
the right. This is only partially visualized.

Other: None
IMPRESSION: 1. No acute intracranial abnormality.
2. No facial fracture.
3. No acute cervical spine fracture.
4. Right frontal and periorbital soft tissue swelling without
evidence for a fracture.
5. Possible nondisplaced fracture involving the posterior fourth rib
on the right. This is only partially visualized.

## 2020-02-02 IMAGING — CT CT CERVICAL SPINE WITHOUT CONTRAST
3 of 14 series · 7 of 33 positions shown, 8 images · non-contrast
Comparison: CT head dated June 11, 2018

CLINICAL DATA: Acute pain due to trauma

EXAM:
CT HEAD WITHOUT CONTRAST
CT MAXILLOFACIAL WITHOUT CONTRAST
CT CERVICAL SPINE WITHOUT CONTRAST
TECHNIQUE: Multidetector CT imaging of the head, cervical spine, and
maxillofacial structures were performed using the standard protocol
without intravenous contrast. Multiplanar CT image reconstructions
of the cervical spine and maxillofacial structures were also
generated.

[Series 9: 1.0 thin soft tissue · axial · 0.34mm/px · z∈[-103,-30]mm · 3 of 147 slices shown]
[im 37/147  soft-tissue]
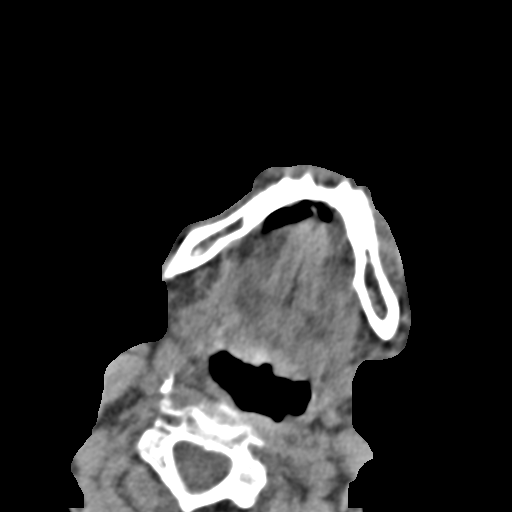
[im 74/147  soft-tissue]
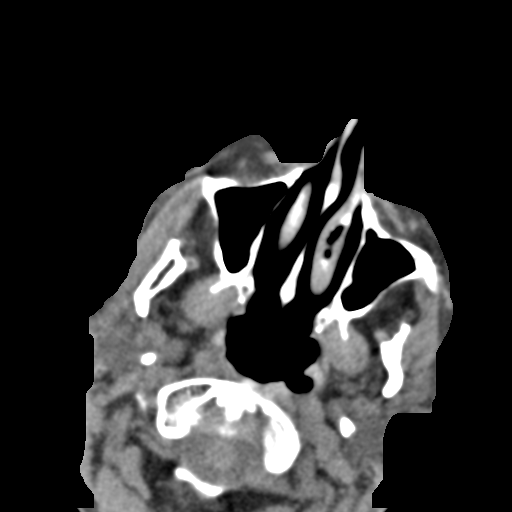
[im 110/147  soft-tissue]
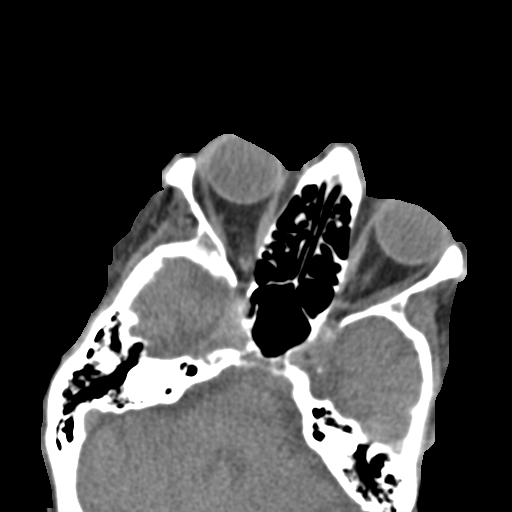

[Series 18: c_spine 2.0 3 st · axial · 0.34mm/px · z∈[-176,-32]mm · 3 of 73 slices shown, 4 images]
[im 1/73  soft-tissue]
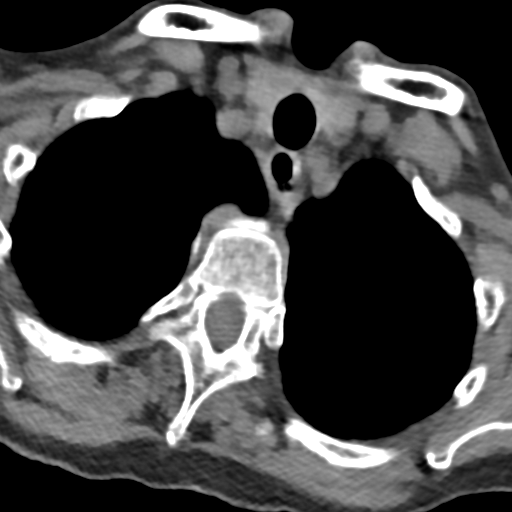
[im 1/73  bone]
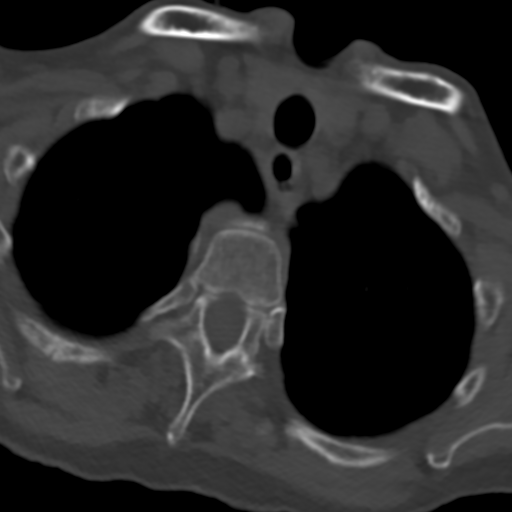
[im 37/73  bone]
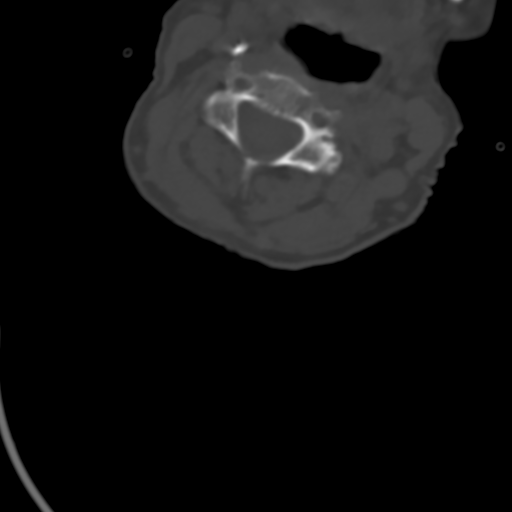
[im 73/73  bone]
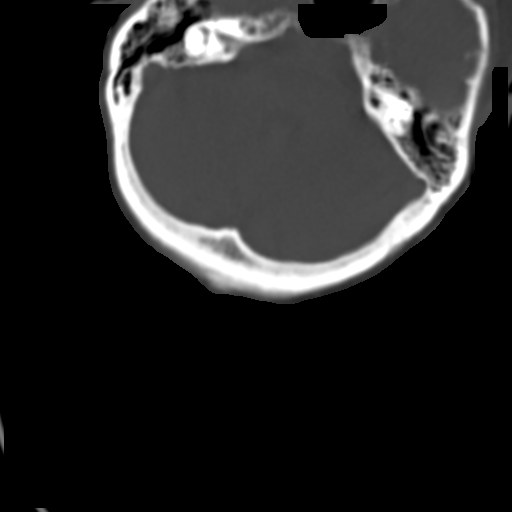

[Series 22: sagittal bone · sagittal · 0.25mm/px · 1 of 55 slices shown]
[im 28/55  bone]
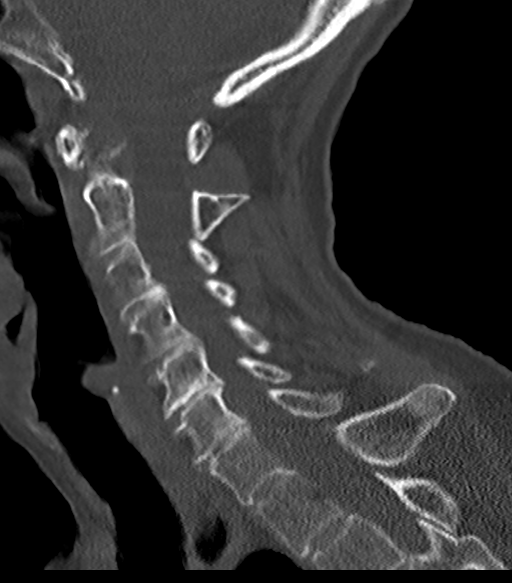

[7 of 33 positions shown; findings below may reference images not displayed]

FINDINGS: CT HEAD FINDINGS

Brain: No evidence of acute infarction, hemorrhage, hydrocephalus,
extra-axial collection or mass lesion/mass effect. Again noted is
global atrophy and chronic microvascular ischemic changes.

Vascular: No hyperdense vessel or unexpected calcification.

Skull: Normal. Negative for fracture or focal lesion. There is
frontal scalp swelling without evidence for an underlying fracture.

Other: None.

CT MAXILLOFACIAL FINDINGS

Osseous: No fracture or mandibular dislocation. No destructive
process.

Orbits: Negative. No traumatic or inflammatory finding.

Sinuses: Clear.

Soft tissues: There is right frontal and right periorbital soft
tissue swelling without evidence for an underlying fracture.

CT CERVICAL SPINE FINDINGS

Alignment: Normal.

Skull base and vertebrae: No acute fracture. No primary bone lesion
or focal pathologic process.

Soft tissues and spinal canal: No prevertebral fluid or swelling. No
visible canal hematoma.

Disc levels: There is multilevel disc height loss throughout the
cervical spine, greatest at the lower cervical levels.

Upper chest: There may be a fracture of the posterior fourth rib on
the right. This is only partially visualized.

Other: None
IMPRESSION: 1. No acute intracranial abnormality.
2. No facial fracture.
3. No acute cervical spine fracture.
4. Right frontal and periorbital soft tissue swelling without
evidence for a fracture.
5. Possible nondisplaced fracture involving the posterior fourth rib
on the right. This is only partially visualized.
